# Patient Record
Sex: Male | Born: 1950
Health system: Southern US, Community
[De-identification: ages and names within clinical notes are randomized; demographics above are authoritative.]

## PROBLEM LIST (undated history)

## (undated) DIAGNOSIS — E785 Hyperlipidemia, unspecified: Secondary | ICD-10-CM

## (undated) DIAGNOSIS — I10 Essential (primary) hypertension: Secondary | ICD-10-CM

## (undated) DIAGNOSIS — E119 Type 2 diabetes mellitus without complications: Secondary | ICD-10-CM

## (undated) HISTORY — DX: Essential (primary) hypertension: I10

## (undated) HISTORY — DX: Hyperlipidemia, unspecified: E78.5

## (undated) HISTORY — PX: CORONARY STENT PLACEMENT: SHX1402

## (undated) HISTORY — DX: Type 2 diabetes mellitus without complications: E11.9

---

## 2014-04-13 DIAGNOSIS — N400 Enlarged prostate without lower urinary tract symptoms: Secondary | ICD-10-CM

## 2014-04-13 HISTORY — DX: Benign prostatic hyperplasia without lower urinary tract symptoms: N40.0

## 2014-05-20 DIAGNOSIS — Z1211 Encounter for screening for malignant neoplasm of colon: Secondary | ICD-10-CM | POA: Insufficient documentation

## 2014-05-20 DIAGNOSIS — Z860101 Personal history of adenomatous and serrated colon polyps: Secondary | ICD-10-CM

## 2014-05-20 HISTORY — DX: Personal history of adenomatous and serrated colon polyps: Z86.0101

## 2014-05-20 HISTORY — DX: Encounter for screening for malignant neoplasm of colon: Z12.11

## 2014-07-08 DIAGNOSIS — K5730 Diverticulosis of large intestine without perforation or abscess without bleeding: Secondary | ICD-10-CM | POA: Insufficient documentation

## 2014-07-08 HISTORY — DX: Diverticulosis of large intestine without perforation or abscess without bleeding: K57.30

## 2014-08-09 DIAGNOSIS — E785 Hyperlipidemia, unspecified: Secondary | ICD-10-CM

## 2014-08-09 DIAGNOSIS — I10 Essential (primary) hypertension: Secondary | ICD-10-CM | POA: Insufficient documentation

## 2014-08-09 DIAGNOSIS — E559 Vitamin D deficiency, unspecified: Secondary | ICD-10-CM | POA: Insufficient documentation

## 2014-08-09 HISTORY — DX: Vitamin D deficiency, unspecified: E55.9

## 2014-08-09 HISTORY — DX: Essential (primary) hypertension: I10

## 2014-08-09 HISTORY — DX: Hyperlipidemia, unspecified: E78.5

## 2015-09-30 DIAGNOSIS — I2510 Atherosclerotic heart disease of native coronary artery without angina pectoris: Secondary | ICD-10-CM

## 2015-09-30 HISTORY — DX: Atherosclerotic heart disease of native coronary artery without angina pectoris: I25.10

## 2015-11-21 DIAGNOSIS — E1165 Type 2 diabetes mellitus with hyperglycemia: Secondary | ICD-10-CM | POA: Insufficient documentation

## 2015-11-21 DIAGNOSIS — E119 Type 2 diabetes mellitus without complications: Secondary | ICD-10-CM | POA: Insufficient documentation

## 2015-11-21 HISTORY — DX: Type 2 diabetes mellitus with hyperglycemia: E11.65

## 2016-01-23 DIAGNOSIS — Z9181 History of falling: Secondary | ICD-10-CM | POA: Insufficient documentation

## 2016-01-23 HISTORY — DX: History of falling: Z91.81

## 2016-06-20 DIAGNOSIS — R194 Change in bowel habit: Secondary | ICD-10-CM

## 2016-06-20 HISTORY — DX: Change in bowel habit: R19.4

## 2017-06-11 ENCOUNTER — Encounter: Payer: Self-pay | Admitting: Cardiology

## 2017-06-18 ENCOUNTER — Ambulatory Visit (INDEPENDENT_AMBULATORY_CARE_PROVIDER_SITE_OTHER): Payer: Medicare HMO | Admitting: Cardiology

## 2017-06-18 ENCOUNTER — Encounter: Payer: Self-pay | Admitting: Cardiology

## 2017-06-18 VITALS — BP 106/68 | HR 68 | Resp 10 | Ht 71.0 in | Wt 188.8 lb

## 2017-06-18 DIAGNOSIS — E782 Mixed hyperlipidemia: Secondary | ICD-10-CM | POA: Diagnosis not present

## 2017-06-18 DIAGNOSIS — I10 Essential (primary) hypertension: Secondary | ICD-10-CM | POA: Diagnosis not present

## 2017-06-18 DIAGNOSIS — E088 Diabetes mellitus due to underlying condition with unspecified complications: Secondary | ICD-10-CM

## 2017-06-18 DIAGNOSIS — I2510 Atherosclerotic heart disease of native coronary artery without angina pectoris: Secondary | ICD-10-CM | POA: Diagnosis not present

## 2017-06-18 HISTORY — DX: Diabetes mellitus due to underlying condition with unspecified complications: E08.8

## 2017-06-18 NOTE — Patient Instructions (Addendum)
Medication Instructions:  Your physician recommends that you continue on your current medications as directed. Please refer to the Current Medication list given to you today.  Labwork: None  Testing/Procedures: Your physician has requested that you have en exercise stress myoview. For further information please visit [email or web address]. Please follow instruction sheet, as given.  Your physician has requested that you have an echocardiogram. Echocardiography is a painless test that uses sound waves to create images of your heart. It provides your doctor with information about the size and shape of your heart and how well your heart's chambers and valves are working. This procedure takes approximately one hour. There are no restrictions for this procedure.  Follow-Up: Your physician wants you to follow-up in: 6 months. You will receive a reminder letter in the mail two months in advance. If you don't receive a letter, please call our office to schedule the follow-up appointment.  Any Other Special Instructions Will Be Listed Below (If Applicable).  Please note that any paperwork needing to be filled out by the provider will need to be addressed at the front desk prior to seeing the provider. Please note that any paperwork FMLA, Disability or other documents regarding health condition is subject to a $25.00 charge that must be received prior to completion of paperwork in the form of a money order or check.    If you need a refill on your cardiac medications before your next appointment, please call your pharmacy.

## 2017-06-18 NOTE — Progress Notes (Signed)
Cardiology Office Note:    Date:  06/18/2017   ID:  Gary Barnett, DOB Nov 14, 1950, MRN [id number]  PCP:  Arlyss Repress, MD  Cardiologist:  Jenean Lindau, MD   Referring MD: Arlyss Repress, MD    ASSESSMENT:    1. Coronary artery disease involving native coronary artery of native heart without angina pectoris   2. Essential hypertension   3. Mixed hyperlipidemia   4. Diabetes mellitus due to underlying condition with complication, without long-term current use of insulin (HCC)    PLAN:    In order of problems listed above:  1. Secondary prevention stressed to the patient. Importance of compliance with diet and medication and exercise stressed and he vocalized understanding. His blood pressure stable and lipids are followed by his primary care physician. 2. Patient has continued multiple risk factors and leads a fairly sedentary lifestyle. His wife mentions to me that it has been a long-time since he has been evaluated for obstructive coronary artery disease and for that reason he will undergo Lexiscan sestamibi. Echocardiogram will be done to assess murmur heard on auscultation. 3. Sublingual nitroglycerin protocol explained to the patient and he already knows about for obvious reasons. He'll be seen in follow-up appointment in 6 months or earlier if he has any concerns.   Medication Adjustments/Labs and Tests Ordered: Current medicines are reviewed at length with the patient today.  Concerns regarding medicines are outlined above.  No orders of the defined types were placed in this encounter.  No orders of the defined types were placed in this encounter.    History of Present Illness:    Gary Barnett is a 66 y.o. male who is being seen today for the evaluation of Coronary artery disease at the request of Arlyss Repress, MD. Patient is a pleasant 66 year old male accompanied by his wife. He has history of coronary artery disease and underwent coronary stenting in 2002,  essential hypertension, dyslipidemia and diabetes mellitus. He leads a fairly sedentary lifestyle. He tells me that he walks regularly to the best of his ability. He is here to be established. He denies any chest pain orthopnea or PND. At the time of my evaluation he is alert awake oriented and in no distress.  Past Medical History:  Diagnosis Date  . Diabetes mellitus without complication ([place])   . Hyperlipidemia   . Hypertension     History reviewed. No pertinent surgical history.  Current Medications: Current Meds  Medication Sig  . ACETAMINOPHEN ER PO Take by mouth.  Marland Kitchen aspirin 325 MG tablet Take 325 mg by mouth.  . carvedilol (COREG) 25 MG tablet Take 12.5 mg by mouth 2 (two) times daily with a meal.   . clonazePAM (KLONOPIN) 0.5 MG tablet Take 0.5 mg by mouth 2 (two) times daily as needed for anxiety (Sleep).   Marland Kitchen ibuprofen (ADVIL,MOTRIN) 200 MG tablet Take 200 mg by mouth.  . levocetirizine (XYZAL) 5 MG tablet Take 5 mg by mouth every evening.   Marland Kitchen lisinopril (PRINIVIL,ZESTRIL) 5 MG tablet Take 5 mg by mouth daily.   . metFORMIN (GLUCOPHAGE) 1000 MG tablet Take 1,000 mg by mouth 2 (two) times daily with a meal.   . niacin 500 MG tablet Take 500 mg by mouth daily.   . nitroGLYCERIN (NITROSTAT) 0.4 MG SL tablet Place 0.4 mg under the tongue every 5 (five) minutes as needed for chest pain.   . pravastatin (PRAVACHOL) 40 MG tablet Take 40 mg by mouth daily.   . sertraline (ZOLOFT)  50 MG tablet Take 50 mg by mouth daily.   . [DISCONTINUED] meloxicam (MOBIC) 7.5 MG tablet Take 7.5 mg by mouth.  . [DISCONTINUED] Omega-3 Fatty Acids (FISH OIL) 1000 MG CAPS Frequency:   Dosage:0   MG  Instructions:  Note:Take 1 capsule twice daily     Allergies:   Clopidogrel   Social History   Social History  . Marital status: Married    Spouse name: N/A  . Number of children: N/A  . Years of education: N/A   Social History Main Topics  . Smoking status: Former Research scientist (life sciences)  . Smokeless tobacco: Never  Used  . Alcohol use No  . Drug use: No  . Sexual activity: Not Asked   Other Topics Concern  . None   Social History Narrative  . None     Family History: The patient's family history includes Heart attack in his brother; Heart disease in his brother; Hyperlipidemia in his mother; Hypertension in his father.  ROS:   Please see the history of present illness.    All other systems reviewed and are negative.  EKGs/Labs/Other Studies Reviewed:    The following studies were reviewed today: I reviewed RECORDS from primary care and discussed with the patient and his wife multiple questions that they had asked with their satisfaction.   Recent Labs: No results found for requested labs within last 8760 hours.  Recent Lipid Panel No results found for: CHOL, TRIG, HDL, CHOLHDL, VLDL, LDLCALC, LDLDIRECT  Physical Exam:    VS:  BP 106/68   Pulse 68   Resp 10   Ht 5\' 11"  (1.803 m)   Wt 188 lb 12.8 oz (85.6 kg)   BMI 26.33 kg/m     Wt Readings from Last 3 Encounters:  06/18/17 188 lb 12.8 oz (85.6 kg)     GEN: Patient is in no acute distress HEENT: Normal NECK: No JVD; No carotid bruits LYMPHATICS: No lymphadenopathy CARDIAC: S1 S2 regular, 2/6 systolic murmur at the apex. RESPIRATORY:  Clear to auscultation without rales, wheezing or rhonchi  ABDOMEN: Soft, non-tender, non-distended MUSCULOSKELETAL:  No edema; No deformity  SKIN: Warm and dry NEUROLOGIC:  Alert and oriented x 3 PSYCHIATRIC:  Normal affect    Signed, Jenean Lindau, MD  06/18/2017 4:48 PM    [facility]

## 2017-06-26 ENCOUNTER — Other Ambulatory Visit (INDEPENDENT_AMBULATORY_CARE_PROVIDER_SITE_OTHER): Payer: Medicare HMO

## 2017-06-26 DIAGNOSIS — I2510 Atherosclerotic heart disease of native coronary artery without angina pectoris: Secondary | ICD-10-CM

## 2017-06-26 DIAGNOSIS — I10 Essential (primary) hypertension: Secondary | ICD-10-CM

## 2017-06-27 ENCOUNTER — Other Ambulatory Visit (HOSPITAL_COMMUNITY): Payer: Medicare HMO

## 2017-06-27 ENCOUNTER — Encounter (HOSPITAL_COMMUNITY): Payer: Medicare HMO

## 2017-07-04 ENCOUNTER — Telehealth (HOSPITAL_COMMUNITY): Payer: Self-pay | Admitting: *Deleted

## 2017-07-04 NOTE — Telephone Encounter (Signed)
Left message on voicemail per DPR in reference to upcoming appointment scheduled on 07/10/17 with detailed instructions given per Myocardial Perfusion Study Information Sheet for the test. LM to arrive 15 minutes early, and that it is imperative to arrive on time for appointment to keep from having the test rescheduled. If you need to cancel or reschedule your appointment, please call the office within 24 hours of your appointment. Failure to do so may result in a cancellation of your appointment, and a $50 no show fee. Phone number given for call back for any questions. Kirstie Peri

## 2017-07-10 ENCOUNTER — Encounter (HOSPITAL_COMMUNITY): Payer: Medicare HMO

## 2017-07-10 ENCOUNTER — Other Ambulatory Visit (HOSPITAL_COMMUNITY): Payer: Medicare HMO

## 2017-08-14 ENCOUNTER — Telehealth (HOSPITAL_COMMUNITY): Payer: Self-pay | Admitting: Cardiology

## 2017-08-14 NOTE — Telephone Encounter (Signed)
06/24/17 NS/D.MILLER 08/07/17 /lmom evd  Patient cancelled appts on 8/23 an 9/5.Marland KitchenRG

## 2017-08-16 ENCOUNTER — Telehealth: Payer: Self-pay

## 2017-08-16 ENCOUNTER — Telehealth (HOSPITAL_COMMUNITY): Payer: Self-pay | Admitting: Cardiology

## 2017-08-16 NOTE — Telephone Encounter (Signed)
07/09/17 LMOM evd Cancel Rsn: Patient (pt has shingles wcb when shingles clear up ) 08/07/17 LMOM evd 08/15/17 LMOM evd   User: Cherie Dark A Date/time: 08/14/2017 1:43 PM  Comment: Called pt and lmsg for him to CB to get rescheduled for echo and myoview.   Context: [place] Schedule Orders/Appt Requests Outcome: Left Message  Phone number: [phone] Phone Type: Home Phone  Comm. type: Telephone Call type: Outgoing  Contact: [name] Relation to patient: Self  Letter:

## 2017-08-16 NOTE — Telephone Encounter (Signed)
Please call patient's wife , she has questions about stress test and medications.cn

## 2017-08-16 NOTE — Telephone Encounter (Signed)
Message left on patient's voice mail.

## 2017-08-16 NOTE — Telephone Encounter (Signed)
Dr. Geraldo Pitter personally spoke with the patient's wife to clarify.

## 2017-08-26 ENCOUNTER — Telehealth (HOSPITAL_COMMUNITY): Payer: Self-pay | Admitting: *Deleted

## 2017-08-26 NOTE — Telephone Encounter (Signed)
Left message on voicemail per DPR in reference to upcoming appointment scheduled on 08/29/17 at 0945 with detailed instructions given per Myocardial Perfusion Study Information Sheet for the test. LM to arrive 15 minutes early, and that it is imperative to arrive on time for appointment to keep from having the test rescheduled. If you need to cancel or reschedule your appointment, please call the office within 24 hours of your appointment. Failure to do so may result in a cancellation of your appointment, and a $50 no show fee. Phone number given for call back for any questions.

## 2017-08-29 ENCOUNTER — Other Ambulatory Visit: Payer: Self-pay

## 2017-08-29 ENCOUNTER — Ambulatory Visit (HOSPITAL_COMMUNITY): Payer: Medicare HMO | Attending: Cardiovascular Disease

## 2017-08-29 ENCOUNTER — Ambulatory Visit (HOSPITAL_BASED_OUTPATIENT_CLINIC_OR_DEPARTMENT_OTHER): Payer: Medicare HMO

## 2017-08-29 DIAGNOSIS — I10 Essential (primary) hypertension: Secondary | ICD-10-CM | POA: Insufficient documentation

## 2017-08-29 DIAGNOSIS — E785 Hyperlipidemia, unspecified: Secondary | ICD-10-CM | POA: Insufficient documentation

## 2017-08-29 DIAGNOSIS — I2510 Atherosclerotic heart disease of native coronary artery without angina pectoris: Secondary | ICD-10-CM | POA: Diagnosis not present

## 2017-08-29 DIAGNOSIS — E782 Mixed hyperlipidemia: Secondary | ICD-10-CM | POA: Diagnosis not present

## 2017-08-29 DIAGNOSIS — R9439 Abnormal result of other cardiovascular function study: Secondary | ICD-10-CM | POA: Insufficient documentation

## 2017-08-29 DIAGNOSIS — Z8249 Family history of ischemic heart disease and other diseases of the circulatory system: Secondary | ICD-10-CM | POA: Insufficient documentation

## 2017-08-29 DIAGNOSIS — I700 Atherosclerosis of aorta: Secondary | ICD-10-CM | POA: Insufficient documentation

## 2017-08-29 DIAGNOSIS — Z87891 Personal history of nicotine dependence: Secondary | ICD-10-CM | POA: Diagnosis not present

## 2017-08-29 DIAGNOSIS — E088 Diabetes mellitus due to underlying condition with unspecified complications: Secondary | ICD-10-CM

## 2017-08-29 LAB — MYOCARDIAL PERFUSION IMAGING
CHL CUP RESTING HR STRESS: 56 {beats}/min
CSEPPHR: 92 {beats}/min
LVDIAVOL: 102 mL (ref 62–150)
LVSYSVOL: 56 mL
RATE: 0.32
SDS: 4
SRS: 14
SSS: 18
TID: 1.11

## 2017-08-29 MED ORDER — REGADENOSON 0.4 MG/5ML IV SOLN
0.4000 mg | Freq: Once | INTRAVENOUS | Status: AC
  Administered 2017-08-29: 0.4 mg via INTRAVENOUS

## 2017-08-29 MED ORDER — TECHNETIUM TC 99M TETROFOSMIN IV KIT
32.8000 | PACK | Freq: Once | INTRAVENOUS | Status: AC | PRN
  Administered 2017-08-29: 32.8 via INTRAVENOUS
  Filled 2017-08-29: qty 33

## 2017-08-29 MED ORDER — TECHNETIUM TC 99M TETROFOSMIN IV KIT
10.7000 | PACK | Freq: Once | INTRAVENOUS | Status: AC | PRN
  Administered 2017-08-29: 10.7 via INTRAVENOUS
  Filled 2017-08-29: qty 11

## 2017-11-11 DIAGNOSIS — M159 Polyosteoarthritis, unspecified: Secondary | ICD-10-CM

## 2017-11-11 DIAGNOSIS — F419 Anxiety disorder, unspecified: Secondary | ICD-10-CM

## 2017-11-11 DIAGNOSIS — M150 Primary generalized (osteo)arthritis: Secondary | ICD-10-CM

## 2017-11-11 DIAGNOSIS — F325 Major depressive disorder, single episode, in full remission: Secondary | ICD-10-CM | POA: Insufficient documentation

## 2017-11-11 HISTORY — DX: Primary generalized (osteo)arthritis: M15.0

## 2017-11-11 HISTORY — DX: Polyosteoarthritis, unspecified: M15.9

## 2017-11-11 HISTORY — DX: Major depressive disorder, single episode, in full remission: F32.5

## 2017-11-11 HISTORY — DX: Anxiety disorder, unspecified: F41.9

## 2018-03-20 DIAGNOSIS — R5381 Other malaise: Secondary | ICD-10-CM

## 2018-03-20 DIAGNOSIS — Z125 Encounter for screening for malignant neoplasm of prostate: Secondary | ICD-10-CM | POA: Insufficient documentation

## 2018-03-20 HISTORY — DX: Encounter for screening for malignant neoplasm of prostate: Z12.5

## 2018-03-20 HISTORY — DX: Other malaise: R53.81

## 2019-05-29 ENCOUNTER — Other Ambulatory Visit: Payer: Self-pay

## 2019-06-03 ENCOUNTER — Other Ambulatory Visit: Payer: Self-pay

## 2019-06-03 ENCOUNTER — Ambulatory Visit (INDEPENDENT_AMBULATORY_CARE_PROVIDER_SITE_OTHER): Payer: Medicare HMO | Admitting: Family Medicine

## 2019-06-03 ENCOUNTER — Encounter: Payer: Self-pay | Admitting: Family Medicine

## 2019-06-03 VITALS — BP 124/84 | HR 64 | Temp 97.8°F | Ht 71.0 in | Wt 195.0 lb

## 2019-06-03 DIAGNOSIS — E119 Type 2 diabetes mellitus without complications: Secondary | ICD-10-CM | POA: Diagnosis not present

## 2019-06-03 DIAGNOSIS — Z125 Encounter for screening for malignant neoplasm of prostate: Secondary | ICD-10-CM

## 2019-06-03 DIAGNOSIS — Z8249 Family history of ischemic heart disease and other diseases of the circulatory system: Secondary | ICD-10-CM

## 2019-06-03 DIAGNOSIS — Z0000 Encounter for general adult medical examination without abnormal findings: Secondary | ICD-10-CM | POA: Diagnosis not present

## 2019-06-03 DIAGNOSIS — Z114 Encounter for screening for human immunodeficiency virus [HIV]: Secondary | ICD-10-CM

## 2019-06-03 DIAGNOSIS — G4700 Insomnia, unspecified: Secondary | ICD-10-CM

## 2019-06-03 HISTORY — DX: Insomnia, unspecified: G47.00

## 2019-06-03 LAB — LIPID PANEL
Cholesterol: 122 mg/dL (ref 0–200)
HDL: 34.1 mg/dL — ABNORMAL LOW (ref >39.00)
LDL Cholesterol: 57 mg/dL (ref 0–99)
NonHDL: 88.31
Total CHOL/HDL Ratio: 4
Triglycerides: 155 mg/dL — ABNORMAL HIGH (ref 0.0–149.0)
VLDL: 31 mg/dL (ref 0.0–40.0)

## 2019-06-03 LAB — COMPREHENSIVE METABOLIC PANEL
ALT: 25 U/L (ref 0–53)
AST: 17 U/L (ref 0–37)
Albumin: 4.7 g/dL (ref 3.5–5.2)
Alkaline Phosphatase: 102 U/L (ref 39–117)
BUN: 14 mg/dL (ref 6–23)
CO2: 27 mEq/L (ref 19–32)
Calcium: 9.5 mg/dL (ref 8.4–10.5)
Chloride: 101 mEq/L (ref 96–112)
Creatinine, Ser: 0.81 mg/dL (ref 0.40–1.50)
GFR: 95.4 mL/min (ref >60.00)
Glucose, Bld: 131 mg/dL — ABNORMAL HIGH (ref 70–99)
Potassium: 4.4 mEq/L (ref 3.5–5.1)
Sodium: 139 mEq/L (ref 135–145)
Total Bilirubin: 1 mg/dL (ref 0.2–1.2)
Total Protein: 6.9 g/dL (ref 6.0–8.3)

## 2019-06-03 LAB — CBC
HCT: 43.4 % (ref 39.0–52.0)
Hemoglobin: 14.7 g/dL (ref 13.0–17.0)
MCHC: 33.8 g/dL (ref 30.0–36.0)
MCV: 89.4 fl (ref 78.0–100.0)
Platelets: 214 10*3/uL (ref 150.0–400.0)
RBC: 4.85 Mil/uL (ref 4.22–5.81)
RDW: 14 % (ref 11.5–15.5)
WBC: 7.3 10*3/uL (ref 4.0–10.5)

## 2019-06-03 LAB — MICROALBUMIN / CREATININE URINE RATIO
Creatinine,U: 119.6 mg/dL
Microalb Creat Ratio: 0.6 mg/g (ref 0.0–30.0)
Microalb, Ur: <0.7 mg/dL (ref 0.0–1.9)

## 2019-06-03 LAB — HEMOGLOBIN A1C: Hgb A1c MFr Bld: 8.4 % — ABNORMAL HIGH (ref 4.6–6.5)

## 2019-06-03 LAB — PSA: PSA: 1.15 ng/mL (ref 0.10–4.00)

## 2019-06-03 MED ORDER — CARVEDILOL 25 MG PO TABS: 12.5000 mg | ORAL_TABLET | Freq: Two times a day (BID) | ORAL | 2 refills | Status: DC

## 2019-06-03 MED ORDER — ACCU-CHEK AVIVA PLUS VI STRP: ORAL_STRIP | 2 refills | Status: DC

## 2019-06-03 MED ORDER — LEVOCETIRIZINE DIHYDROCHLORIDE 5 MG PO TABS: 5.0000 mg | ORAL_TABLET | Freq: Every evening | ORAL | 2 refills | Status: DC

## 2019-06-03 MED ORDER — TRAZODONE HCL 50 MG PO TABS: 25.0000 mg | ORAL_TABLET | Freq: Every evening | ORAL | 2 refills | Status: DC | PRN

## 2019-06-03 MED ORDER — SERTRALINE HCL 50 MG PO TABS: 50.0000 mg | ORAL_TABLET | Freq: Every day | ORAL | 2 refills | Status: DC

## 2019-06-03 MED ORDER — GLIPIZIDE ER 2.5 MG PO TB24: 2.5000 mg | ORAL_TABLET | Freq: Every day | ORAL | 2 refills | Status: DC

## 2019-06-03 MED ORDER — METFORMIN HCL 1000 MG PO TABS: 1000.0000 mg | ORAL_TABLET | Freq: Two times a day (BID) | ORAL | 2 refills | Status: DC

## 2019-06-03 MED ORDER — ACCU-CHEK SOFTCLIX LANCETS MISC: 2 refills | Status: DC

## 2019-06-03 MED ORDER — PRAVASTATIN SODIUM 40 MG PO TABS: 40.0000 mg | ORAL_TABLET | Freq: Every day | ORAL | 2 refills | Status: DC

## 2019-06-03 MED ORDER — LISINOPRIL 5 MG PO TABS: 5.0000 mg | ORAL_TABLET | Freq: Every day | ORAL | 2 refills | Status: DC

## 2019-06-03 NOTE — Progress Notes (Signed)
Chief Complaint  Patient presents with  . New Patient (Initial Visit)    establish today       New Patient Visit SUBJECTIVE: HPI: Gary Barnett is an 68 y.o.male who is being seen for establishing care.  Pt w hx of anxiety/insomnia. He takes Zoloft 50 mg/d, tolerating well, reports compliance. Was on Elavil in past, did not work well for sleep. He had been on Klonopin nightly as well, last taking consistently 3 mo ago. Interested in trazodone. Would also use Melatonin.   Hx of DM, on metformin 1 g bid. Diet is healthy. Reports he was well controlled. Due for eye exam. Has not had foot exam or urine test in quite some time. Taking pravastatin.   Allergies  Allergen Reactions  . Clopidogrel     Past Medical History:  Diagnosis Date  . Diabetes mellitus without complication ([place])   . Hyperlipidemia   . Hypertension    History reviewed. No pertinent surgical history. Family History  Problem Relation Age of Onset  . Heart attack Brother   . Heart disease Brother   . Hyperlipidemia Mother   . Hypertension Father    Allergies  Allergen Reactions  . Clopidogrel     Current Outpatient Medications:  .  Accu-Chek Softclix Lancets lancets, Use twice daily to check blood sugar, Disp: 100 each, Rfl: 2 .  aspirin 325 MG tablet, Take 325 mg by mouth., Disp: , Rfl:  .  carvedilol (COREG) 25 MG tablet, Take 0.5 tablets (12.5 mg total) by mouth 2 (two) times daily with a meal., Disp: 90 tablet, Rfl: 2 .  ELDERBERRY PO, Take by mouth., Disp: , Rfl:  .  glipiZIDE (GLUCOTROL XL) 2.5 MG 24 hr tablet, Take 1 tablet (2.5 mg total) by mouth daily with breakfast., Disp: 90 tablet, Rfl: 2 .  glucose blood (ACCU-CHEK AVIVA PLUS) test strip, Test twice daily for blood sugar, Disp: 100 each, Rfl: 2 .  levocetirizine (XYZAL) 5 MG tablet, Take 1 tablet (5 mg total) by mouth every evening., Disp: 90 tablet, Rfl: 2 .  lisinopril (ZESTRIL) 5 MG tablet, Take 1 tablet (5 mg total) by mouth daily., Disp: 90  tablet, Rfl: 2 .  metFORMIN (GLUCOPHAGE) 1000 MG tablet, Take 1 tablet (1,000 mg total) by mouth 2 (two) times daily with a meal., Disp: 180 tablet, Rfl: 2 .  niacin 500 MG tablet, Take 500 mg by mouth daily. , Disp: , Rfl:  .  nitroGLYCERIN (NITROSTAT) 0.4 MG SL tablet, Place 0.4 mg under the tongue every 5 (five) minutes as needed for chest pain. , Disp: , Rfl:  .  pravastatin (PRAVACHOL) 40 MG tablet, Take 1 tablet (40 mg total) by mouth daily., Disp: 90 tablet, Rfl: 2 .  sertraline (ZOLOFT) 50 MG tablet, Take 1 tablet (50 mg total) by mouth daily., Disp: 90 tablet, Rfl: 2 .  Turmeric 500 MG CAPS, Take by mouth., Disp: , Rfl:  .  vitamin C (ASCORBIC ACID) 500 MG tablet, Take 500 mg by mouth 2 (two) times daily., Disp: , Rfl:  .  clonazePAM (KLONOPIN) 0.5 MG tablet, Take 0.5 mg by mouth 2 (two) times daily as needed for anxiety (Sleep). , Disp: , Rfl:  .  traZODone (DESYREL) 50 MG tablet, Take 0.5-1 tablets (25-50 mg total) by mouth at bedtime as needed for sleep., Disp: 90 tablet, Rfl: 2  ROS Cardiovascular: Denies chest pain  Respiratory: Denies dyspnea   OBJECTIVE: BP 124/84 (BP Location: Left Arm, Patient Position: Sitting, Cuff Size:  Normal)   Pulse 64   Temp 97.8 F (36.6 C) (Oral)   Ht 5\' 11"  (1.803 m)   Wt 195 lb (88.5 kg)   SpO2 98%   BMI 27.20 kg/m   Constitutional: -  VS reviewed -  Well developed, well nourished, appears stated age -  No apparent distress  Psychiatric: -  Oriented to person, place, and time -  Memory intact -  Affect and mood normal -  Fluent conversation, good eye contact -  Judgment and insight age appropriate  Eye: -  Conjunctivae clear, no discharge -  Pupils symmetric, round, reactive to light  ENMT: -  MMM    Pharynx moist, no exudate, no erythema  Neck: -  No gross swelling, no palpable masses -  Thyroid midline, not enlarged, mobile, no palpable masses  Cardiovascular: -  RRR -  No LE edema  Respiratory: -  Normal respiratory effort, no  accessory muscle use, no retraction -  Breath sounds equal, no wheezes, no ronchi, no crackles  Musculoskeletal: -  No clubbing, no cyanosis -  Gait normal  Skin: -  No lesions on feet b/l -  Warm and dry to palpation   ASSESSMENT/PLAN: Insomnia, unspecified type - Plan: stop Klonopin, start trazodone. [facility] info given. Sleep hygiene encouraged and written down.  Diabetes mellitus without complication ([place]) - Plan: Hemoglobin A1c, Microalbumin / creatinine urine ratio, HM DIABETES FOOT EXAM, due for eye exam.  Family history of heart attack - Plan: Ambulatory referral to Cardiology, per his request  Well adult exam - Plan: CBC, Comprehensive metabolic panel, Lipid panel  Screening for malignant neoplasm of prostate - Plan: PSA; pt requesting, has had it followed  Patient instructed to sign release of records form from his previous PCP. [facility], Dr Luetta Nutting Patient should return in 2 mo for CPE. The patient voiced understanding and agreement to the plan.   [place], DO 06/03/19  3:07 PM

## 2019-06-03 NOTE — Patient Instructions (Addendum)
The new Shingrix vaccine (for shingles) is a 2 shot series. It can make people feel low energy, achy and almost like they have the flu for 48 hours after injection. Please plan accordingly when deciding on when to get this shot. Call your pharmacy regarding this. The second shot of the series is less severe regarding the side effects, but it still lasts 48 hours.   Flu shot is not available yet.   Please consider counseling. Contact [phone] to schedule an appointment or inquire about cost/insurance coverage.  Sleep Hygiene Tips:  Do not watch TV or look at screens within 1 hour of going to bed. If you do, make sure there is a blue light filter (nighttime mode) involved.  Try to go to bed around the same time every night. Wake up at the same time within 1 hour of regular time. Ex: If you wake up at 7 AM for work, do not sleep past 8 AM on days that you don't work.  Do not drink alcohol before bedtime.  Do not consume caffeine-containing beverages after noon or within 9 hours of intended bedtime.  Get regular exercise/physical activity in your life, but not within 2 hours of planned bedtime.  Do not take naps.   Do not eat within 2 hours of planned bedtime.  Melatonin, 3-5 mg 30-60 minutes before planned bedtime may be helpful.   The bed should be for sleep or sex only. If after 20-30 minutes you are unable to fall asleep, get up and do something relaxing. Do this until you feel ready to go to sleep again.   Let us know if you need anything.

## 2019-06-05 ENCOUNTER — Ambulatory Visit: Payer: Medicare HMO | Admitting: Family Medicine

## 2019-06-08 ENCOUNTER — Other Ambulatory Visit: Payer: Self-pay | Admitting: Family Medicine

## 2019-06-08 MED ORDER — NITROGLYCERIN 0.4 MG SL SUBL: 0.4000 mg | SUBLINGUAL_TABLET | SUBLINGUAL | 1 refills | Status: DC | PRN

## 2019-06-08 NOTE — Telephone Encounter (Signed)
Rx sent 

## 2019-06-08 NOTE — Telephone Encounter (Signed)
Copied from [place] [phone]. Topic: Quick Communication - Rx Refill/Question >> Jun 08, 2019  4:43 PM Nils Flack wrote: Medication: nitroGLYCERIN (NITROSTAT) 0.4 MG SL tablet  Has the patient contacted their pharmacy? Yes.   (Agent: If no, request that the patient contact the pharmacy for the refill.) (Agent: If yes, when and what did the pharmacy advise?)  Preferred Pharmacy (with phone number or street name): [email or web address]   Agent: Please be advised that RX refills may take up to 3 business days. We ask that you follow-up with your pharmacy.

## 2019-06-09 ENCOUNTER — Other Ambulatory Visit: Payer: Self-pay | Admitting: Family Medicine

## 2019-06-09 LAB — HIV ANTIBODY (ROUTINE TESTING W REFLEX): HIV 1&2 Ab, 4th Generation: NONREACTIVE

## 2019-06-09 MED ORDER — GLIPIZIDE ER 5 MG PO TB24: 5.0000 mg | ORAL_TABLET | Freq: Every day | ORAL | 1 refills | Status: DC

## 2019-06-22 ENCOUNTER — Telehealth: Payer: Self-pay | Admitting: Family Medicine

## 2019-06-22 NOTE — Telephone Encounter (Signed)
Patient's wife is to request last lab results to be mailed to him. Thank you

## 2019-06-22 NOTE — Telephone Encounter (Signed)
Mailed to the patient

## 2019-06-26 NOTE — Telephone Encounter (Signed)
Mailed results  

## 2019-06-26 NOTE — Telephone Encounter (Signed)
Patient wife called back to request copy of his lab results mailed to them. She received her copy but not his. Please advise

## 2019-07-21 ENCOUNTER — Ambulatory Visit: Payer: Medicare HMO | Admitting: Cardiology

## 2019-07-27 ENCOUNTER — Telehealth: Payer: Self-pay | Admitting: Family Medicine

## 2019-07-27 NOTE — Telephone Encounter (Signed)
Copied from [place] [phone]. Topic: General - Other >> Jul 27, 2019  1:49 PM Pauline Good wrote: Reason for CRM: pt stated the lab results that was supposed to be fax to Dr Luetta Nutting (urologist) in [place] hadn't been received from his psa test.   Called the patient and informed faxed those results today 07/27/2019 to Dr. Elta Guadeloupe at [phone]

## 2019-07-28 DIAGNOSIS — R3989 Other symptoms and signs involving the genitourinary system: Secondary | ICD-10-CM | POA: Diagnosis not present

## 2019-07-30 ENCOUNTER — Ambulatory Visit (INDEPENDENT_AMBULATORY_CARE_PROVIDER_SITE_OTHER): Payer: Medicare HMO | Admitting: Cardiology

## 2019-07-30 ENCOUNTER — Other Ambulatory Visit: Payer: Self-pay

## 2019-07-30 ENCOUNTER — Encounter: Payer: Self-pay | Admitting: Cardiology

## 2019-07-30 VITALS — BP 116/70 | HR 68 | Ht 71.0 in | Wt 196.0 lb

## 2019-07-30 DIAGNOSIS — E782 Mixed hyperlipidemia: Secondary | ICD-10-CM | POA: Diagnosis not present

## 2019-07-30 DIAGNOSIS — I2510 Atherosclerotic heart disease of native coronary artery without angina pectoris: Secondary | ICD-10-CM

## 2019-07-30 DIAGNOSIS — I10 Essential (primary) hypertension: Secondary | ICD-10-CM

## 2019-07-30 DIAGNOSIS — E088 Diabetes mellitus due to underlying condition with unspecified complications: Secondary | ICD-10-CM

## 2019-07-30 NOTE — Patient Instructions (Signed)
Medication Instructions:  Your physician recommends that you continue on your current medications as directed. Please refer to the Current Medication list given to you today.  If you need a refill on your cardiac medications before your next appointment, please call your pharmacy.   Lab work: NONE If you have labs (blood work) drawn today and your tests are completely normal, you will receive your results only by: . MyChart Message (if you have MyChart) OR . A paper copy in the mail If you have any lab test that is abnormal or we need to change your treatment, we will call you to review the results.  Testing/Procedures: You had an EKG performed today  Follow-Up: At [facility], you and your health needs are our priority.  As part of our continuing mission to provide you with exceptional heart care, we have created designated Provider Care Teams.  These Care Teams include your primary Cardiologist (physician) and Advanced Practice Providers (APPs -  Physician Assistants and Nurse Practitioners) who all work together to provide you with the care you need, when you need it. You will need a follow up appointment in 6 months.   

## 2019-07-30 NOTE — Progress Notes (Signed)
Cardiology Office Note:    Date:  07/30/2019   ID:  Gary Barnett, DOB 1951/02/27, MRN [id number]  PCP:  Shelda Pal, DO  Cardiologist:  Jenean Lindau, MD   Referring MD: Shelda Pal*    ASSESSMENT:    1. Coronary artery disease involving native coronary artery of native heart without angina pectoris   2. Essential hypertension   3. Diabetes mellitus due to underlying condition with unspecified complications (Gary Barnett)   4. Mixed hyperlipidemia    PLAN:    In order of problems listed above:  1. Secondary prevention stressed with the patient.  Importance of compliance with diet and medication stressed and he vocalized understanding.  His blood pressure stable.  Diet was discussed 2. Dyslipidemia and diabetes mellitus: Diet was discussed.  He has an excellent effort tolerance.  His hemoglobin A1c is elevated and I told him to consider seeing a dietitian and possibly endocrinologist to see if his medications need to be fine-tuned.  He is going to talk to his primary care physician about it. 3. Patient will be seen in follow-up appointment in 6 months or earlier if the patient has any concerns    Medication Adjustments/Labs and Tests Ordered: Current medicines are reviewed at length with the patient today.  Concerns regarding medicines are outlined above.  No orders of the defined types were placed in this encounter.  No orders of the defined types were placed in this encounter.    Chief Complaint  Patient presents with  . Annual Exam    Been 2 years since saw Cardiologist     History of Present Illness:    Gary Barnett is a 68 y.o. male.  Patient has past medical history of coronary artery disease, essential hypertension, dyslipidemia and diabetes mellitus.  He denies any problems at this time and takes care of activities of daily living.  No chest pain orthopnea or PND.  At the time of my evaluation, the patient is alert awake oriented and in no  distress.  He uses his elliptical for about an hour a day on a daily basis.  Past Medical History:  Diagnosis Date  . Diabetes mellitus without complication ([place])   . Hyperlipidemia   . Hypertension     No past surgical history on file.  Current Medications: Current Meds  Medication Sig  . Accu-Chek Softclix Lancets lancets Use twice daily to check blood sugar  . aspirin 325 MG tablet Take 325 mg by mouth.  . carvedilol (COREG) 25 MG tablet Take 0.5 tablets (12.5 mg total) by mouth 2 (two) times daily with a meal.  . ELDERBERRY PO Take by mouth.  Marland Kitchen glipiZIDE (GLUCOTROL XL) 5 MG 24 hr tablet Take 1 tablet (5 mg total) by mouth daily with breakfast.  . glucose blood (ACCU-CHEK AVIVA PLUS) test strip Test twice daily for blood sugar  . levocetirizine (XYZAL) 5 MG tablet Take 1 tablet (5 mg total) by mouth every evening.  Marland Kitchen lisinopril (ZESTRIL) 5 MG tablet Take 1 tablet (5 mg total) by mouth daily.  . metFORMIN (GLUCOPHAGE) 1000 MG tablet Take 1 tablet (1,000 mg total) by mouth 2 (two) times daily with a meal.  . niacin 500 MG tablet Take 500 mg by mouth daily.   . nitroGLYCERIN (NITROSTAT) 0.4 MG SL tablet Place 1 tablet (0.4 mg total) under the tongue every 5 (five) minutes x 3 doses as needed for chest pain.  . pravastatin (PRAVACHOL) 40 MG tablet Take 1 tablet (40 mg  total) by mouth daily.  . sertraline (ZOLOFT) 50 MG tablet Take 1 tablet (50 mg total) by mouth daily.  . traZODone (DESYREL) 50 MG tablet Take 0.5-1 tablets (25-50 mg total) by mouth at bedtime as needed for sleep.  . Turmeric 500 MG CAPS Take by mouth.  . vitamin C (ASCORBIC ACID) 500 MG tablet Take 500 mg by mouth 2 (two) times daily.     Allergies:   Clopidogrel   Social History   Socioeconomic History  . Marital status: Married    Spouse name: Not on file  . Number of children: Not on file  . Years of education: Not on file  . Highest education level: Not on file  Occupational History  . Not on file   Social Needs  . Financial resource strain: Not on file  . Food insecurity    Worry: Not on file    Inability: Not on file  . Transportation needs    Medical: Not on file    Non-medical: Not on file  Tobacco Use  . Smoking status: Former Research scientist (life sciences)  . Smokeless tobacco: Never Used  Substance and Sexual Activity  . Alcohol use: No  . Drug use: No  . Sexual activity: Not on file  Lifestyle  . Physical activity    Days per week: Not on file    Minutes per session: Not on file  . Stress: Not on file  Relationships  . Social Herbalist on phone: Not on file    Gets together: Not on file    Attends religious service: Not on file    Active member of club or organization: Not on file    Attends meetings of clubs or organizations: Not on file    Relationship status: Not on file  Other Topics Concern  . Not on file  Social History Narrative  . Not on file     Family History: The patient's family history includes Heart attack in his brother; Heart disease in his brother; Hyperlipidemia in his mother; Hypertension in his father.  ROS:   Please see the history of present illness.    All other systems reviewed and are negative.  EKGs/Labs/Other Studies Reviewed:    The following studies were reviewed today: I reviewed lab work.  He recently had blood work and lipids by his primary care physician and was told that they were fine   Recent Labs: 06/03/2019: ALT 25; BUN 14; Creatinine, Ser 0.81; Hemoglobin 14.7; Platelets 214.0; Potassium 4.4; Sodium 139  Recent Lipid Panel    Component Value Date/Time   CHOL 122 06/03/2019 1330   TRIG 155.0 (H) 06/03/2019 1330   HDL 34.10 (L) 06/03/2019 1330   CHOLHDL 4 06/03/2019 1330   VLDL 31.0 06/03/2019 1330   LDLCALC 57 06/03/2019 1330    Physical Exam:    VS:  BP 116/70 (BP Location: Left Arm, Patient Position: Sitting, Cuff Size: Normal)   Pulse 68   Ht 5\' 11"  (1.803 m)   Wt 196 lb (88.9 kg)   SpO2 98%   BMI 27.34 kg/m      Wt Readings from Last 3 Encounters:  07/30/19 196 lb (88.9 kg)  06/03/19 195 lb (88.5 kg)  08/29/17 188 lb (85.3 kg)     GEN: Patient is in no acute distress HEENT: Normal NECK: No JVD; No carotid bruits LYMPHATICS: No lymphadenopathy CARDIAC: Hear sounds regular, 2/6 systolic murmur at the apex. RESPIRATORY:  Clear to auscultation without rales, wheezing or  rhonchi  ABDOMEN: Soft, non-tender, non-distended MUSCULOSKELETAL:  No edema; No deformity  SKIN: Warm and dry NEUROLOGIC:  Alert and oriented x 3 PSYCHIATRIC:  Normal affect   Signed, Jenean Lindau, MD  07/30/2019 11:23 AM    [place]

## 2019-08-03 DIAGNOSIS — D2372 Other benign neoplasm of skin of left lower limb, including hip: Secondary | ICD-10-CM | POA: Diagnosis not present

## 2019-08-03 DIAGNOSIS — D2271 Melanocytic nevi of right lower limb, including hip: Secondary | ICD-10-CM | POA: Diagnosis not present

## 2019-08-03 DIAGNOSIS — L723 Sebaceous cyst: Secondary | ICD-10-CM | POA: Diagnosis not present

## 2019-08-03 DIAGNOSIS — D2272 Melanocytic nevi of left lower limb, including hip: Secondary | ICD-10-CM | POA: Diagnosis not present

## 2019-08-03 DIAGNOSIS — D1801 Hemangioma of skin and subcutaneous tissue: Secondary | ICD-10-CM | POA: Diagnosis not present

## 2019-08-03 DIAGNOSIS — L820 Inflamed seborrheic keratosis: Secondary | ICD-10-CM | POA: Diagnosis not present

## 2019-08-03 DIAGNOSIS — D225 Melanocytic nevi of trunk: Secondary | ICD-10-CM | POA: Diagnosis not present

## 2019-08-03 DIAGNOSIS — L821 Other seborrheic keratosis: Secondary | ICD-10-CM | POA: Diagnosis not present

## 2019-08-05 ENCOUNTER — Ambulatory Visit (INDEPENDENT_AMBULATORY_CARE_PROVIDER_SITE_OTHER): Payer: Medicare HMO | Admitting: Family Medicine

## 2019-08-05 ENCOUNTER — Other Ambulatory Visit: Payer: Self-pay

## 2019-08-05 ENCOUNTER — Encounter: Payer: Self-pay | Admitting: Family Medicine

## 2019-08-05 VITALS — BP 104/68 | HR 73 | Temp 95.0°F | Ht 71.0 in | Wt 199.0 lb

## 2019-08-05 DIAGNOSIS — Z0000 Encounter for general adult medical examination without abnormal findings: Secondary | ICD-10-CM

## 2019-08-05 DIAGNOSIS — Z136 Encounter for screening for cardiovascular disorders: Secondary | ICD-10-CM | POA: Diagnosis not present

## 2019-08-05 DIAGNOSIS — Z23 Encounter for immunization: Secondary | ICD-10-CM | POA: Diagnosis not present

## 2019-08-05 MED ORDER — GLIPIZIDE ER 5 MG PO TB24: 5.0000 mg | ORAL_TABLET | Freq: Every day | ORAL | 2 refills | Status: DC

## 2019-08-05 MED ORDER — METFORMIN HCL 1000 MG PO TABS: 1000.0000 mg | ORAL_TABLET | Freq: Two times a day (BID) | ORAL | 2 refills | Status: DC

## 2019-08-05 NOTE — Progress Notes (Signed)
Chief Complaint  Patient presents with  . Annual Exam    Well Male Gary Barnett is here for a complete physical.   His last physical was >1 year ago.  Current diet: in general, a "healthy" diet.   Current exercise: walking, seated elliptical Weight trend: stable Daytime fatigue? No. Seat belt? Yes.    Health maintenance Shingrix- No Colonoscopy- Yes Tetanus- No Hep C- Yes Pneumonia vaccine- Yes; had 4 yrs ago,  AAA screening- Due  Past Medical History:  Diagnosis Date  . Diabetes mellitus without complication ([place])   . Hyperlipidemia   . Hypertension      Past Surgical History:  Procedure Laterality Date  . CORONARY STENT PLACEMENT     Thinks left main    Medications  Current Outpatient Medications on File Prior to Visit  Medication Sig Dispense Refill  . Accu-Chek Softclix Lancets lancets Use twice daily to check blood sugar 100 each 2  . aspirin 325 MG tablet Take 325 mg by mouth.    . carvedilol (COREG) 25 MG tablet Take 0.5 tablets (12.5 mg total) by mouth 2 (two) times daily with a meal. 90 tablet 2  . ELDERBERRY PO Take by mouth.    Marland Kitchen glucose blood (ACCU-CHEK AVIVA PLUS) test strip Test twice daily for blood sugar 100 each 2  . levocetirizine (XYZAL) 5 MG tablet Take 1 tablet (5 mg total) by mouth every evening. 90 tablet 2  . lisinopril (ZESTRIL) 5 MG tablet Take 1 tablet (5 mg total) by mouth daily. 90 tablet 2  . niacin 500 MG tablet Take 500 mg by mouth daily.     . nitroGLYCERIN (NITROSTAT) 0.4 MG SL tablet Place 1 tablet (0.4 mg total) under the tongue every 5 (five) minutes x 3 doses as needed for chest pain. 25 tablet 1  . pravastatin (PRAVACHOL) 40 MG tablet Take 1 tablet (40 mg total) by mouth daily. 90 tablet 2  . sertraline (ZOLOFT) 50 MG tablet Take 1 tablet (50 mg total) by mouth daily. 90 tablet 2  . traZODone (DESYREL) 50 MG tablet Take 0.5-1 tablets (25-50 mg total) by mouth at bedtime as needed for sleep. 90 tablet 2  . Turmeric 500 MG CAPS  Take by mouth.    . vitamin C (ASCORBIC ACID) 500 MG tablet Take 500 mg by mouth 2 (two) times daily.      Allergies Allergies  Allergen Reactions  . Clopidogrel     Family History Family History  Problem Relation Age of Onset  . Heart attack Brother   . Heart disease Brother   . Hyperlipidemia Mother   . Hypertension Father     Review of Systems: Constitutional:  no fevers or chills Eye:  no recent significant change in vision Ear/Nose/Mouth/Throat:  Ears:  no recent hearing loss Nose/Mouth/Throat:  no complaints of nasal congestion or sore throat Cardiovascular:  no chest pain, no palpitations Respiratory:  no cough and no shortness of breath Gastrointestinal:  no abdominal pain, no change in bowel habits GU:  Male: negative for dysuria, frequency, and incontinence and negative for prostate symptoms Musculoskeletal/Extremities:  no pain, redness, or swelling of the joints Integumentary (Skin):  no abnormal skin lesions reported Neurologic:  no headaches, Endocrine:  No unexpected weight changes Hematologic/Lymphatic:  no areas of easy bruising  Exam BP 104/68 (BP Location: Left Arm, Patient Position: Sitting, Cuff Size: Normal)   Pulse 73   Temp (!) 95 F (35 C) (Temporal)   Ht 5\' 11"  (1.803 m)  Wt 199 lb (90.3 kg)   SpO2 96%   BMI 27.75 kg/m  General:  well developed, well nourished, in no apparent distress Skin:  no significant moles, warts, or growths Head:  no masses, lesions, or tenderness Eyes:  pupils equal and round, sclera anicteric without injection Ears:  canals without lesions, TMs shiny without retraction, no obvious effusion, no erythema Nose:  nares patent, septum midline, mucosa normal Throat/Pharynx:  lips and gingiva without lesion; tongue and uvula midline; non-inflamed pharynx; no exudates or postnasal drainage Neck: neck supple without adenopathy, thyromegaly, or masses Lungs:  clear to auscultation, breath sounds equal bilaterally, no  respiratory distress Cardio:  regular rate and rhythm, no LE edema or bruits Abdomen:  abdomen soft, nontender; bowel sounds normal; no masses or organomegaly Rectal: Deferred Musculoskeletal:  symmetrical muscle groups noted without atrophy or deformity Extremities:  no clubbing, cyanosis, or edema, no deformities, no skin discoloration Neuro:  gait normal; deep tendon reflexes normal and symmetric Psych: well oriented with normal range of affect and appropriate judgment/insight  Assessment and Plan  Well adult exam  Screening for AAA (abdominal aortic aneurysm) - Plan: US AORTA MEDICARE SCREENING   Well 68 y.o. male. Counseled on diet and exercise. Call eye provider.  Other orders as above. Follow up in 2 mo for DM visit, sounds like he is doing well.  The patient voiced understanding and agreement to the plan.  [place], DO 08/05/19 10:58 AM

## 2019-08-05 NOTE — Addendum Note (Signed)
Addended by: Sharon Seller B on: 08/05/2019 11:09 AM   Modules accepted: Orders

## 2019-08-05 NOTE — Patient Instructions (Addendum)
Keep the diet clean and stay active.  Call your eye doctor for an appointment.   Someone will reach out regarding the ultrasound.  Let us know if you need anything.

## 2019-08-11 ENCOUNTER — Ambulatory Visit (HOSPITAL_BASED_OUTPATIENT_CLINIC_OR_DEPARTMENT_OTHER)
Admission: RE | Admit: 2019-08-11 | Discharge: 2019-08-11 | Disposition: A | Discharge status: Home / Self Care | Payer: Medicare HMO | Source: Ambulatory Visit | Attending: Family Medicine | Admitting: Family Medicine

## 2019-08-11 ENCOUNTER — Encounter (HOSPITAL_BASED_OUTPATIENT_CLINIC_OR_DEPARTMENT_OTHER): Payer: Self-pay

## 2019-08-11 ENCOUNTER — Other Ambulatory Visit: Payer: Self-pay

## 2019-08-11 DIAGNOSIS — Z87891 Personal history of nicotine dependence: Secondary | ICD-10-CM | POA: Insufficient documentation

## 2019-08-11 DIAGNOSIS — Z136 Encounter for screening for cardiovascular disorders: Secondary | ICD-10-CM | POA: Diagnosis not present

## 2019-08-11 DIAGNOSIS — I714 Abdominal aortic aneurysm, without rupture: Secondary | ICD-10-CM | POA: Diagnosis not present

## 2019-09-24 DIAGNOSIS — M9903 Segmental and somatic dysfunction of lumbar region: Secondary | ICD-10-CM | POA: Diagnosis not present

## 2019-09-24 DIAGNOSIS — M9901 Segmental and somatic dysfunction of cervical region: Secondary | ICD-10-CM | POA: Diagnosis not present

## 2019-10-06 ENCOUNTER — Other Ambulatory Visit: Payer: Self-pay

## 2019-10-07 ENCOUNTER — Ambulatory Visit (INDEPENDENT_AMBULATORY_CARE_PROVIDER_SITE_OTHER): Payer: Medicare HMO | Admitting: Family Medicine

## 2019-10-07 ENCOUNTER — Encounter: Payer: Self-pay | Admitting: Family Medicine

## 2019-10-07 VITALS — BP 104/64 | HR 76 | Temp 96.0°F | Ht 72.0 in | Wt 195.4 lb

## 2019-10-07 DIAGNOSIS — E1165 Type 2 diabetes mellitus with hyperglycemia: Secondary | ICD-10-CM

## 2019-10-07 DIAGNOSIS — G4700 Insomnia, unspecified: Secondary | ICD-10-CM

## 2019-10-07 LAB — HEMOGLOBIN A1C: Hgb A1c MFr Bld: 7.7 % — ABNORMAL HIGH (ref 4.6–6.5)

## 2019-10-07 MED ORDER — TRAZODONE HCL 100 MG PO TABS: 100.0000 mg | ORAL_TABLET | Freq: Every evening | ORAL | 2 refills | Status: DC | PRN

## 2019-10-07 NOTE — Patient Instructions (Signed)
Give us 2-3 business days to get the results of your labs back.   Keep the diet clean and stay active.  Let us know if you need anything. 

## 2019-10-07 NOTE — Progress Notes (Signed)
Subjective:   Chief Complaint  Patient presents with  . Follow-up    Gary Barnett is a 68 y.o. male here for follow-up of diabetes.   [place]'s self monitored glucose range is 120-130's fasting.  Patient denies hypoglycemic reactions. He checks his glucose levels 1 time per day. Patient does not require insulin.   Medications include: Metformin 1000 mg bid, glipizide 5 mg/d Diet is fair Exercise: walking, elliptical machine, wt resistance exercise.   Insomnia Doing OK w trazodone, would like to increase dose. Compliant, no AE's. Sometimes feels it isn't strong enough. No new triggers or happenings.   Past Medical History:  Diagnosis Date  . Diabetes mellitus without complication ([place])   . Hyperlipidemia   . Hypertension      Related testing: Date of retinal exam: Going tomorrow Pneumovax: done Flu Shot: done  Review of Systems: Pulmonary:  No SOB Cardiovascular:  No chest pain  Objective:  BP 104/64 (BP Location: Left Arm, Patient Position: Sitting, Cuff Size: Normal)   Pulse 76   Temp (!) 96 F (35.6 C) (Temporal)   Ht 6' (1.829 m)   Wt 195 lb 6 oz (88.6 kg)   SpO2 97%   BMI 26.50 kg/m  General:  Well developed, well nourished, in no apparent distress Lungs:  CTAB, no access msc use Cardio:  RRR, no bruits, no LE edema Psych: Age appropriate judgment and insight  Assessment:   Type 2 diabetes mellitus with hyperglycemia, without long-term current use of insulin (HCC) - Plan: HgB A1c  Insomnia, unspecified type - Plan: traZODone (DESYREL) 100 MG tablet   Plan:   Cont Metfomrin and Glipizide. Eager to see A1c.  Counseled on diet and exercise. Send results in mail.  Increase dose of Trazodone from 50 mg qhs to 100 mg qhs.  F/u in 5 mo. The patient voiced understanding and agreement to the plan.  [place], DO 10/07/19 10:52 AM

## 2019-10-28 ENCOUNTER — Other Ambulatory Visit: Payer: Self-pay | Admitting: Family Medicine

## 2019-10-28 MED ORDER — BD SWAB SINGLE USE REGULAR PADS: MEDICATED_PAD | 3 refills | Status: DC

## 2019-10-28 MED ORDER — ACCU-CHEK AVIVA PLUS VI STRP: ORAL_STRIP | 3 refills | Status: DC

## 2019-10-28 MED ORDER — ACCU-CHEK SOFTCLIX LANCETS MISC: 3 refills | Status: DC

## 2020-01-12 ENCOUNTER — Telehealth: Payer: Self-pay | Admitting: Family Medicine

## 2020-01-12 ENCOUNTER — Ambulatory Visit (INDEPENDENT_AMBULATORY_CARE_PROVIDER_SITE_OTHER): Payer: Medicare Other | Admitting: Family Medicine

## 2020-01-12 ENCOUNTER — Other Ambulatory Visit: Payer: Self-pay

## 2020-01-12 ENCOUNTER — Encounter: Payer: Self-pay | Admitting: Family Medicine

## 2020-01-12 DIAGNOSIS — G4700 Insomnia, unspecified: Secondary | ICD-10-CM

## 2020-01-12 DIAGNOSIS — E1165 Type 2 diabetes mellitus with hyperglycemia: Secondary | ICD-10-CM | POA: Diagnosis not present

## 2020-01-12 MED ORDER — GLIPIZIDE ER 5 MG PO TB24: 5.0000 mg | ORAL_TABLET | Freq: Every day | ORAL | 2 refills | Status: DC

## 2020-01-12 MED ORDER — NITROGLYCERIN 0.4 MG SL SUBL: 0.4000 mg | SUBLINGUAL_TABLET | SUBLINGUAL | 1 refills | Status: DC | PRN

## 2020-01-12 MED ORDER — METFORMIN HCL 1000 MG PO TABS: 1000.0000 mg | ORAL_TABLET | Freq: Two times a day (BID) | ORAL | 2 refills | Status: DC

## 2020-01-12 MED ORDER — LISINOPRIL 5 MG PO TABS: 5.0000 mg | ORAL_TABLET | Freq: Every day | ORAL | 2 refills | Status: DC

## 2020-01-12 MED ORDER — MIRTAZAPINE 30 MG PO TABS: 30.0000 mg | ORAL_TABLET | Freq: Every day | ORAL | 2 refills | Status: DC

## 2020-01-12 MED ORDER — PRAVASTATIN SODIUM 40 MG PO TABS: 40.0000 mg | ORAL_TABLET | Freq: Every day | ORAL | 2 refills | Status: DC

## 2020-01-12 MED ORDER — SERTRALINE HCL 50 MG PO TABS: 50.0000 mg | ORAL_TABLET | Freq: Every day | ORAL | 2 refills | Status: DC

## 2020-01-12 MED ORDER — CARVEDILOL 25 MG PO TABS: 12.5000 mg | ORAL_TABLET | Freq: Two times a day (BID) | ORAL | 2 refills | Status: DC

## 2020-01-12 NOTE — Telephone Encounter (Signed)
Have taken care of

## 2020-01-12 NOTE — Telephone Encounter (Signed)
Patient would like all medication to have a 90 day supply.

## 2020-01-12 NOTE — Progress Notes (Signed)
Subjective:   Chief Complaint  Patient presents with  . Follow-up    3 month    Gary Barnett is a 69 y.o. male here for follow-up of diabetes.  Due to COVID-19 pandemic, we are interacting via telephone. I verified patient's ID using 2 identifiers. Patient agreed to proceed with visit via this method. Patient is at home, I am at office. Patient and I are present for visit.  [place]'s self monitored glucose range is mid-low 100's.  Patient denies hypoglycemic reactions. He checks his glucose levels daily. Patient does not require insulin.   Medications include: Glucotrol XL 5 mg/d, metformin 1000 mg bid Diet is fair overall.  Exercise: Walking, wt resistance exercise Interested in seeing a nutritionist.  Insomnia Hx of insomnia, did not do well with Elavil or trazodone. Requesting something else.   Past Medical History:  Diagnosis Date  . Diabetes mellitus without complication ([place])   . Hyperlipidemia   . Hypertension      Related testing: Date of retinal exam: Due; just scheduled Pneumovax: done Flu Shot: done  Review of Systems: Pulmonary:  No SOB Cardiovascular:  No chest pain  Objective:  There were no vitals taken for this visit. General:  Well developed, well nourished, in no apparent distress Skin:  Warm, no pallor or diaphoresis Head:  Normocephalic, atraumatic Eyes:  Pupils equal and round, sclera anicteric without injection  Lungs:  CTAB, no access msc use Cardio:  RRR, no bruits, no LE edema Musculoskeletal:  Symmetrical muscle groups noted without atrophy or deformity Neuro:  Sensation intact to pinprick on feet Psych: Age appropriate judgment and insight  Assessment:   Type 2 diabetes mellitus with hyperglycemia, without long-term current use of insulin (HCC) - Plan: Amb ref to Medical Nutrition Therapy-MNT, Hemoglobin A1c  Insomnia, unspecified type - Plan: Ambulatory referral to Pulmonology   Plan:   1- Ck A1c. Refer nutrition. Counseled on diet  and exercise. 2- Refer to pulm, Trial Mirtazapine.  F/u for labs at convenience. Total time: 11 min The patient voiced understanding and agreement to the plan.  [place], DO 01/12/20 2:02 PM

## 2020-01-21 ENCOUNTER — Other Ambulatory Visit: Payer: Medicare Other

## 2020-01-26 DIAGNOSIS — H35033 Hypertensive retinopathy, bilateral: Secondary | ICD-10-CM | POA: Diagnosis not present

## 2020-01-26 LAB — HM DIABETES EYE EXAM

## 2020-01-27 ENCOUNTER — Encounter: Payer: Self-pay | Admitting: Pulmonary Disease

## 2020-01-27 ENCOUNTER — Other Ambulatory Visit: Payer: Self-pay

## 2020-01-27 ENCOUNTER — Ambulatory Visit: Payer: Medicare Other | Admitting: Pulmonary Disease

## 2020-01-27 ENCOUNTER — Institutional Professional Consult (permissible substitution): Payer: Medicare Other | Admitting: Pulmonary Disease

## 2020-01-27 VITALS — BP 128/68 | HR 61 | Temp 97.1°F | Ht 72.0 in | Wt 202.2 lb

## 2020-01-27 DIAGNOSIS — G4700 Insomnia, unspecified: Secondary | ICD-10-CM | POA: Diagnosis not present

## 2020-01-27 MED ORDER — ZALEPLON 5 MG PO CAPS: 5.0000 mg | ORAL_CAPSULE | Freq: Every evening | ORAL | 1 refills | Status: DC | PRN

## 2020-01-27 NOTE — Progress Notes (Signed)
Subjective:    Patient ID: Gary Barnett, male    DOB: Aug 15, 1951, 69 y.o.   MRN: [id number]  Patient with a history of chronic insomnia  Insomnia has been for about 5 years  His spouse was having some health problems about 5 years ago and he was staying up a lot at night to try and make sure she is safe She has improved now but he is left with insomnia  He has a hard time going to sleep, sometimes has a hard time going back to sleep once he wakes up  Usually goes to bed between 11 and 12 PM Sometimes takes him about 15 minutes to an hour to fall asleep He will wake up about 2 to 3 hours after sleeping Wakes up about 2-3 times during the night  Final awakening time about 10 AM  He does take daytime naps sometimes 1-1/2 to 2 hours  He usually does exercise on a regular basis His weight has been relatively stable may have gained about 5 to 10 pounds of the last couple years  Past Medical History:  Diagnosis Date  . Diabetes mellitus without complication ([place])   . Hyperlipidemia   . Hypertension    Social History   Socioeconomic History  . Marital status: Married    Spouse name: Not on file  . Number of children: Not on file  . Years of education: Not on file  . Highest education level: Not on file  Occupational History  . Not on file  Tobacco Use  . Smoking status: Former Smoker    Packs/day: 1.00    Years: 25.00    Pack years: 25.00    Types: Cigarettes  . Smokeless tobacco: Never Used  Substance and Sexual Activity  . Alcohol use: No  . Drug use: No  . Sexual activity: Not on file  Other Topics Concern  . Not on file  Social History Narrative  . Not on file   Social Determinants of Health   Financial Resource Strain:   . Difficulty of Paying Living Expenses:   Food Insecurity:   . Worried About Charity fundraiser in the Last Year:   . Arboriculturist in the Last Year:   Transportation Needs:   . Film/video editor (Medical):   Marland Kitchen Lack of  Transportation (Non-Medical):   Physical Activity:   . Days of Exercise per Week:   . Minutes of Exercise per Session:   Stress:   . Feeling of Stress :   Social Connections:   . Frequency of Communication with Friends and Family:   . Frequency of Social Gatherings with Friends and Family:   . Attends Religious Services:   . Active Member of Clubs or Organizations:   . Attends Archivist Meetings:   Marland Kitchen Marital Status:   Intimate Partner Violence:   . Fear of Current or Ex-Partner:   . Emotionally Abused:   Marland Kitchen Physically Abused:   . Sexually Abused:    Family History  Problem Relation Age of Onset  . Heart attack Brother   . Heart disease Brother   . Hyperlipidemia Mother   . Hypertension Father        Review of Systems  Constitutional: Positive for unexpected weight change. Negative for fever.  HENT: Positive for rhinorrhea. Negative for congestion, dental problem, ear pain, nosebleeds, postnasal drip, sinus pressure, sneezing, sore throat and trouble swallowing.   Eyes: Negative for redness and itching.  Respiratory: Negative  for cough, chest tightness, shortness of breath and wheezing.   Cardiovascular: Negative for palpitations and leg swelling.  Gastrointestinal: Negative for nausea and vomiting.  Genitourinary: Negative for dysuria.  Musculoskeletal: Positive for joint swelling.  Skin: Negative for rash.  Allergic/Immunologic: Negative.  Negative for environmental allergies, food allergies and immunocompromised state.  Neurological: Negative for headaches.  Hematological: Does not bruise/bleed easily.  Psychiatric/Behavioral: Negative for dysphoric mood. The patient is nervous/anxious.        Objective:   Physical Exam HENT:     Head: Normocephalic and atraumatic.     Nose: Nose normal.     Mouth/Throat:     Mouth: Mucous membranes are moist.  Eyes:     Extraocular Movements: Extraocular movements intact.     Pupils: Pupils are equal, round, and  reactive to light.  Cardiovascular:     Rate and Rhythm: Normal rate and regular rhythm.     Pulses: Normal pulses.     Heart sounds: No murmur. No friction rub.  Pulmonary:     Effort: Pulmonary effort is normal. No respiratory distress.     Breath sounds: Normal breath sounds. No stridor. No wheezing or rhonchi.  Musculoskeletal:        General: Normal range of motion.     Cervical back: Normal range of motion and neck supple. No rigidity or tenderness.  Skin:    General: Skin is warm.  Neurological:     General: No focal deficit present.     Mental Status: He is alert.    Vitals:   01/27/20 1138  BP: 128/68  Pulse: 61  Temp: (!) 97.1 F (36.2 C)  SpO2: 97%   Results of the Epworth flowsheet 01/27/2020  Sitting and reading 0  Watching TV 1  Sitting, inactive in a public place (e.g. a theatre or a meeting) 0  As a passenger in a car for an hour without a break 0  Lying down to rest in the afternoon when circumstances permit 1  Sitting and talking to someone 0  Sitting quietly after a lunch without alcohol 0  In a car, while stopped for a few minutes in traffic 0  Total score 2      Assessment & Plan:  .  Sleep onset and sleep maintenance insomnia .  Poor sleep hygiene  .  Importance of obtaining consolidated sleep was discussed  .  Avoidance of daytime naps discussed .  Regular exercises  Trial with Sonata 5 mg nightly  .  I will see him in about 6 weeks  .  Encouraged to call with any significant concerns .  Encouraged to limit bedtime hours to 6 to 8 hours at most, avoid daytime naps

## 2020-01-27 NOTE — Patient Instructions (Addendum)
Sleep onset and sleep maintenance insomnia  Trial with Sonata  Try and consolidate your sleep to 6 to 8 hours  Avoid daytime naps as able  If you laying in bed for too long-this will continue to contribute to disruption of your sleep  I will see you in about 6 weeks

## 2020-01-29 ENCOUNTER — Other Ambulatory Visit: Payer: Medicare Other

## 2020-02-02 ENCOUNTER — Other Ambulatory Visit: Payer: Self-pay | Admitting: Family Medicine

## 2020-02-02 ENCOUNTER — Other Ambulatory Visit: Payer: Self-pay

## 2020-02-02 ENCOUNTER — Other Ambulatory Visit (INDEPENDENT_AMBULATORY_CARE_PROVIDER_SITE_OTHER): Payer: Medicare Other

## 2020-02-02 DIAGNOSIS — E1165 Type 2 diabetes mellitus with hyperglycemia: Secondary | ICD-10-CM

## 2020-02-02 LAB — HEMOGLOBIN A1C: Hgb A1c MFr Bld: 8.3 % — ABNORMAL HIGH (ref 4.6–6.5)

## 2020-02-02 MED ORDER — GLIPIZIDE ER 5 MG PO TB24: 10.0000 mg | ORAL_TABLET | Freq: Every day | ORAL | 2 refills | Status: DC

## 2020-02-04 ENCOUNTER — Telehealth: Payer: Self-pay

## 2020-02-04 ENCOUNTER — Telehealth: Payer: Self-pay | Admitting: Family Medicine

## 2020-02-04 NOTE — Telephone Encounter (Signed)
See result notes. 

## 2020-02-04 NOTE — Telephone Encounter (Signed)
Patient wife called returning miss call from [place] . Would like a call back [phone]

## 2020-02-08 ENCOUNTER — Ambulatory Visit: Payer: Medicare Other | Admitting: Cardiology

## 2020-02-09 ENCOUNTER — Telehealth: Payer: Self-pay | Admitting: Cardiology

## 2020-02-09 NOTE — Telephone Encounter (Signed)
Returned call to wife (ok per DPR)-she is requesting to come with patient to appt on 4/8.  States patient has memory issues and cannot remember what the MD discusses.   Advised ok to come with patient.   Noted in appt note.

## 2020-02-09 NOTE — Telephone Encounter (Signed)
New Message   Pts wife is calling and says she will need to assist the pt to his appt because the pt doesn't remember things   Please advise

## 2020-02-10 ENCOUNTER — Encounter: Payer: Self-pay | Admitting: Dietician

## 2020-02-10 ENCOUNTER — Other Ambulatory Visit: Payer: Self-pay

## 2020-02-10 ENCOUNTER — Encounter: Payer: Medicare Other | Attending: Family Medicine | Admitting: Dietician

## 2020-02-10 DIAGNOSIS — E119 Type 2 diabetes mellitus without complications: Secondary | ICD-10-CM | POA: Insufficient documentation

## 2020-02-10 NOTE — Progress Notes (Signed)
Diabetes Self-Management Education  Visit Type: First/Initial  02/10/2020  [name], identified by name and date of birth, is a 69 y.o. male with a diagnosis of Diabetes: Type 2.   ASSESSMENT  Weight 202 lb (91.6 kg). Body mass index is 27.4 kg/m.   Patient states his fasting BG averages in the 130s (usually between 120-150.)  Does not care for pasta or [place] foods in general. Has a sweet tooth, likes jello and cookies especially. Likes starchy foods including potatoes, corn, and bread. Would like to lose some weight and bring A1c down.   Diabetes Self-Management Education - 02/10/20 1112      Visit Information   Visit Type  First/Initial      Initial Visit   Diabetes Type  Type 2    Are you currently following a meal plan?  No    Are you taking your medications as prescribed?  Yes    Date Diagnosed  [place]   How would you rate your overall health?  Fair      Psychosocial Assessment   Patient Belief/Attitude about Diabetes  Motivated to manage diabetes    Self-care barriers  Other (comment)   no internet access   Other persons present  Patient;Spouse/SO    Patient Concerns  Nutrition/Meal planning;Weight Control    Special Needs  None    Preferred Learning Style  No preference indicated    Learning Readiness  Ready    How often do you need to have someone help you when you read instructions, pamphlets, or other written materials from your doctor or pharmacy?  1 - Never    What is the last grade level you completed in school?  high school      Complications   Last HgB A1C per patient/outside source  8.3 %   02/02/2020   How often do you check your blood sugar?  1-2 times/day    Fasting Blood glucose range (mg/dL)  130-179;70-129    Have you had a dilated eye exam in the past 12 months?  Yes    Have you had a dental exam in the past 12 months?  Yes    Are you checking your feet?  No      Dietary Intake   Breakfast  oatmeal + eggs + bacon +  toast + hashbrowns + sausage + gravy biscuits    Snack (morning)  Lance peanut butter crackers + 8 oz soda    Lunch  salad + crackers   usually skips- eats late breakfast   Snack (afternoon)  sugar-free jello    Dinner  pork loin + corn + potatoes    Snack (evening)  oatmeal cookies    Beverage(s)  soda, unsweetened tea, water      Exercise   Exercise Type  ADL's;Light (walking / raking leaves)    How many days per week to you exercise?  4    How many minutes per day do you exercise?  60    Total minutes per week of exercise  240      Patient Education   Previous Diabetes Education  Yes (please comment)   when diagnosed in 1998   Disease state   Explored patient's options for treatment of their diabetes    Nutrition management   Role of diet in the treatment of diabetes and the relationship between the three main macronutrients and blood glucose level;Food label reading, portion sizes and measuring food.;Carbohydrate  counting      Individualized Goals (developed by patient)   Nutrition  Follow meal plan discussed    Medications  take my medication as prescribed    Monitoring   test my blood glucose as discussed   continue checking fasting daily     Outcomes   Expected Outcomes  Demonstrated interest in learning. Expect positive outcomes    Future DMSE  4-6 wks       Individualized Plan for Diabetes Self-Management Training:   Learning Objective:  Patient will have a greater understanding of diabetes self-management. Patient education plan is to attend individual and/or group sessions per assessed needs and concerns.   Plan:  Patient Instructions  Aim to drink at least 4 bottles of water per day.   Try some of these brands:   Nuun   Amazing Grass   Tomma Rakers Sugar-Free   Whitman's Sugar-Free  Keep carbohydrates to 1/4 of your meal.   Always eat a source of protein with all your meals and snacks.    Expected Outcomes:  Demonstrated interest in learning.  Expect positive outcomes  Education material provided: My Plate and Snack sheet, Types of Sugar, Meal Ideas, Types of Diabetes & BG Control, Breakfast Ideas, Sugary Beverages   If problems or questions, patient to contact team via:  Phone and Email  Future DSME appointment: 4-6 wks

## 2020-02-10 NOTE — Patient Instructions (Addendum)
Aim to drink at least 4 bottles of water per day.   Try some of these brands:   Nuun   Amazing Grass   Tomma Rakers Sugar-Free   Whitman's Sugar-Free  Keep carbohydrates to 1/4 of your meal.   Always eat a source of protein with all your meals and snacks.

## 2020-02-11 ENCOUNTER — Ambulatory Visit (INDEPENDENT_AMBULATORY_CARE_PROVIDER_SITE_OTHER): Payer: Medicare Other | Admitting: Cardiology

## 2020-02-11 ENCOUNTER — Encounter: Payer: Self-pay | Admitting: Cardiology

## 2020-02-11 VITALS — BP 120/70 | HR 68 | Ht 72.0 in | Wt 203.0 lb

## 2020-02-11 DIAGNOSIS — I10 Essential (primary) hypertension: Secondary | ICD-10-CM | POA: Diagnosis not present

## 2020-02-11 DIAGNOSIS — I2510 Atherosclerotic heart disease of native coronary artery without angina pectoris: Secondary | ICD-10-CM | POA: Diagnosis not present

## 2020-02-11 DIAGNOSIS — E782 Mixed hyperlipidemia: Secondary | ICD-10-CM

## 2020-02-11 DIAGNOSIS — E088 Diabetes mellitus due to underlying condition with unspecified complications: Secondary | ICD-10-CM | POA: Diagnosis not present

## 2020-02-11 MED ORDER — NITROGLYCERIN 0.4 MG SL SUBL: 0.4000 mg | SUBLINGUAL_TABLET | SUBLINGUAL | 1 refills | Status: DC | PRN

## 2020-02-11 NOTE — Patient Instructions (Signed)
Medication Instructions:  No medication changes *If you need a refill on your cardiac medications before your next appointment, please call your pharmacy*   Lab Work: Your physician recommends that you return for lab work in: the next few days. You need to have labs done when you are fasting.  You can come Monday through Friday 8:30 am to 12:00 pm and 1:15 to 4:30. You do not need to make an appointment as the order has already been placed. The labs you are going to have done are BMET, LFT and Lipids.  If you have labs (blood work) drawn today and your tests are completely normal, you will receive your results only by: Marland Kitchen MyChart Message (if you have MyChart) OR . A paper copy in the mail If you have any lab test that is abnormal or we need to change your treatment, we will call you to review the results.   Testing/Procedures: Your physician has requested that you have an echocardiogram. Echocardiography is a painless test that uses sound waves to create images of your heart. It provides your doctor with information about the size and shape of your heart and how well your heart's chambers and valves are working. This procedure takes approximately one hour. There are no restrictions for this procedure. Your physician has requested that you have a lexiscan myoview. For further information please visit [email or web address]. Please follow instruction sheet, as given.  Your physician has requested that you have a lexiscan myoview. For further information please visit [email or web address]. Please follow instruction sheet, as given.  The test will take approximately 3 to 4 hours to complete; you may bring reading material.  If someone comes with you to your appointment, they will need to remain in the main lobby due to limited space in the testing area. **If you are pregnant or breastfeeding, please notify the nuclear lab prior to your appointment**  How to prepare for your Myocardial Perfusion  Test: . Do not eat or drink 3 hours prior to your test, except you may have water. . Do not consume products containing caffeine (regular or decaffeinated) 12 hours prior to your test. (ex: coffee, chocolate, sodas, tea). . Do bring a list of your current medications with you.  If not listed below, you may take your medications as normal. . Do wear comfortable clothes (no dresses or overalls) and walking shoes, tennis shoes preferred (No heels or open toe shoes are allowed). . Do NOT wear cologne, perfume, aftershave, or lotions (deodorant is allowed). . If these instructions are not followed, your test will have to be rescheduled.  Do not take carvedilol (Coreg) for 24 hours prior to the test.  Bring the medication to your appointment as you may be required to take it once the test is complete.   Follow-Up: At [facility], you and your health needs are our priority.  As part of our continuing mission to provide you with exceptional heart care, we have created designated Provider Care Teams.  These Care Teams include your primary Cardiologist (physician) and Advanced Practice Providers (APPs -  Physician Assistants and Nurse Practitioners) who all work together to provide you with the care you need, when you need it.  We recommend signing up for the patient portal called "MyChart".  Sign up information is provided on this After Visit Summary.  MyChart is used to connect with patients for Virtual Visits (Telemedicine).  Patients are able to view lab/test results, encounter notes, upcoming appointments, etc.  Non-urgent messages can be  sent to your provider as well.   To learn more about what you can do with MyChart, go to [email or web address].    Your next appointment:   6 month(s)  The format for your next appointment:   In Person  Provider:   Jyl Heinz, MD   Other Instructions NA

## 2020-02-11 NOTE — Progress Notes (Signed)
Cardiology Office Note:    Date:  02/11/2020   ID:  Gary Barnett, DOB 02-14-1951, MRN [id number]  PCP:  Shelda Pal, DO  Cardiologist:  Jenean Lindau, MD   Referring MD: Shelda Pal*    ASSESSMENT:    1. Coronary artery disease involving native coronary artery of native heart without angina pectoris   2. Essential hypertension   3. Mixed hyperlipidemia   4. Diabetes mellitus due to underlying condition with unspecified complications ([place])    PLAN:    In order of problems listed above:  1. Coronary artery disease: Secondary prevention stressed with the patient.  Importance of compliance with diet and medication stressed and he vocalized understanding.  He walks on a regular basis.  Because of some issues with shortness of breath and diet being a diabetic he wants to be evaluated for obstructive coronary artery disease.  Will order a Lexiscan sestamibi.  EKG done today reveals sinus rhythm and nonspecific ST-T changes. 2. Essential hypertension: Blood pressure is stable.  Echocardiogram will be done to assess murmur heard on auscultation. 3. Mixed dyslipidemia and diabetes mellitus: Diet was emphasized.  I would prefer to see the patient on a newer statin such as atorvastatin or rosuvastatin per current guidelines and therefore he will be back in the next few days for Chem-7 liver lipid check and I will change his statin accordingly. 4. Patient will be seen in follow-up appointment in 6 months or earlier if the patient has any concerns    Medication Adjustments/Labs and Tests Ordered: Current medicines are reviewed at length with the patient today.  Concerns regarding medicines are outlined above.  No orders of the defined types were placed in this encounter.  No orders of the defined types were placed in this encounter.    Chief Complaint  Patient presents with  . Follow-up     History of Present Illness:    Gary Barnett is a 69 y.o. male.   Patient has past medical history of coronary artery disease, essential hypertension dyslipidemia and diabetes mellitus.  He denies any problems at this time and takes care of activities of daily living.  No chest pain orthopnea or PND.  He occasionally has dyspnea on exertion.  At the time of my evaluation, the patient is alert awake oriented and in no distress.  Past Medical History:  Diagnosis Date  . Diabetes mellitus without complication ([place])   . Hyperlipidemia   . Hypertension     Past Surgical History:  Procedure Laterality Date  . CORONARY STENT PLACEMENT     Thinks left main, around age 28    Current Medications: Current Meds  Medication Sig  . Accu-Chek Softclix Lancets lancets Use twice daily to check blood sugar  . Alcohol Swabs (B-D SINGLE USE SWABS REGULAR) PADS Use daily when checking blood sugar  . aspirin 325 MG tablet Take 325 mg by mouth.  . carvedilol (COREG) 25 MG tablet Take 0.5 tablets (12.5 mg total) by mouth 2 (two) times daily with a meal.  . ELDERBERRY PO Take by mouth.  Marland Kitchen glipiZIDE (GLUCOTROL XL) 5 MG 24 hr tablet Take 2 tablets (10 mg total) by mouth daily with breakfast.  . glucose blood (ACCU-CHEK AVIVA PLUS) test strip Test twice daily for blood sugar  . levocetirizine (XYZAL) 5 MG tablet Take 1 tablet (5 mg total) by mouth every evening.  Marland Kitchen lisinopril (ZESTRIL) 5 MG tablet Take 1 tablet (5 mg total) by mouth daily.  . metFORMIN (  GLUCOPHAGE) 1000 MG tablet Take 1 tablet (1,000 mg total) by mouth 2 (two) times daily with a meal.  . mirtazapine (REMERON) 30 MG tablet Take 1 tablet (30 mg total) by mouth at bedtime.  . niacin 500 MG tablet Take 500 mg by mouth daily.   . nitroGLYCERIN (NITROSTAT) 0.4 MG SL tablet Place 1 tablet (0.4 mg total) under the tongue every 5 (five) minutes x 3 doses as needed for chest pain.  . pravastatin (PRAVACHOL) 40 MG tablet Take 1 tablet (40 mg total) by mouth daily.  . sertraline (ZOLOFT) 50 MG tablet Take 1 tablet (50 mg  total) by mouth daily.  . vitamin C (ASCORBIC ACID) 500 MG tablet Take 500 mg by mouth 2 (two) times daily.  . zaleplon (SONATA) 5 MG capsule Take 1 capsule (5 mg total) by mouth at bedtime as needed for sleep.     Allergies:   Clopidogrel   Social History   Socioeconomic History  . Marital status: Married    Spouse name: Not on file  . Number of children: Not on file  . Years of education: Not on file  . Highest education level: Not on file  Occupational History  . Not on file  Tobacco Use  . Smoking status: Former Smoker    Packs/day: 1.00    Years: 25.00    Pack years: 25.00    Types: Cigarettes  . Smokeless tobacco: Never Used  Substance and Sexual Activity  . Alcohol use: No  . Drug use: No  . Sexual activity: Not on file  Other Topics Concern  . Not on file  Social History Narrative  . Not on file   Social Determinants of Health   Financial Resource Strain:   . Difficulty of Paying Living Expenses:   Food Insecurity:   . Worried About Charity fundraiser in the Last Year:   . Arboriculturist in the Last Year:   Transportation Needs:   . Film/video editor (Medical):   Marland Kitchen Lack of Transportation (Non-Medical):   Physical Activity:   . Days of Exercise per Week:   . Minutes of Exercise per Session:   Stress:   . Feeling of Stress :   Social Connections:   . Frequency of Communication with Friends and Family:   . Frequency of Social Gatherings with Friends and Family:   . Attends Religious Services:   . Active Member of Clubs or Organizations:   . Attends Archivist Meetings:   Marland Kitchen Marital Status:      Family History: The patient's family history includes Heart attack in his brother; Heart disease in his brother; Hyperlipidemia in his mother; Hypertension in his father.  ROS:   Please see the history of present illness.    All other systems reviewed and are negative.  EKGs/Labs/Other Studies Reviewed:    The following studies were  reviewed today: EKG done today reveals sinus rhythm and nonspecific ST-T changes.   Recent Labs: 06/03/2019: ALT 25; BUN 14; Creatinine, Ser 0.81; Hemoglobin 14.7; Platelets 214.0; Potassium 4.4; Sodium 139  Recent Lipid Panel    Component Value Date/Time   CHOL 122 06/03/2019 1330   TRIG 155.0 (H) 06/03/2019 1330   HDL 34.10 (L) 06/03/2019 1330   CHOLHDL 4 06/03/2019 1330   VLDL 31.0 06/03/2019 1330   LDLCALC 57 06/03/2019 1330    Physical Exam:    VS:  BP 120/70   Pulse 68   Ht 6' (1.829 m)  Wt 203 lb (92.1 kg)   SpO2 98%   BMI 27.53 kg/m     Wt Readings from Last 3 Encounters:  02/11/20 203 lb (92.1 kg)  02/10/20 202 lb (91.6 kg)  01/27/20 202 lb 3.2 oz (91.7 kg)     GEN: Patient is in no acute distress HEENT: Normal NECK: No JVD; No carotid bruits LYMPHATICS: No lymphadenopathy CARDIAC: Hear sounds regular, 2/6 systolic murmur at the apex. RESPIRATORY:  Clear to auscultation without rales, wheezing or rhonchi  ABDOMEN: Soft, non-tender, non-distended MUSCULOSKELETAL:  No edema; No deformity  SKIN: Warm and dry NEUROLOGIC:  Alert and oriented x 3 PSYCHIATRIC:  Normal affect   Signed, Jenean Lindau, MD  02/11/2020 4:25 PM    [facility]

## 2020-02-12 ENCOUNTER — Ambulatory Visit: Payer: Medicare Other | Admitting: Cardiology

## 2020-02-15 ENCOUNTER — Ambulatory Visit (HOSPITAL_BASED_OUTPATIENT_CLINIC_OR_DEPARTMENT_OTHER): Payer: Medicare Other

## 2020-02-15 ENCOUNTER — Telehealth (HOSPITAL_COMMUNITY): Payer: Self-pay

## 2020-02-15 NOTE — Telephone Encounter (Signed)
Detailed instructions left on the patient's answering machine. Asked to call back with any questions. S.Mataeo Ingwersen EMTP 

## 2020-02-16 ENCOUNTER — Other Ambulatory Visit: Payer: Self-pay

## 2020-02-16 ENCOUNTER — Ambulatory Visit (HOSPITAL_BASED_OUTPATIENT_CLINIC_OR_DEPARTMENT_OTHER)
Admission: RE | Admit: 2020-02-16 | Discharge: 2020-02-16 | Disposition: A | Discharge status: Home / Self Care | Payer: Medicare Other | Source: Ambulatory Visit | Attending: Cardiology | Admitting: Cardiology

## 2020-02-16 DIAGNOSIS — E088 Diabetes mellitus due to underlying condition with unspecified complications: Secondary | ICD-10-CM | POA: Diagnosis not present

## 2020-02-16 DIAGNOSIS — I2510 Atherosclerotic heart disease of native coronary artery without angina pectoris: Secondary | ICD-10-CM

## 2020-02-16 DIAGNOSIS — E782 Mixed hyperlipidemia: Secondary | ICD-10-CM | POA: Diagnosis not present

## 2020-02-16 DIAGNOSIS — I10 Essential (primary) hypertension: Secondary | ICD-10-CM | POA: Diagnosis not present

## 2020-02-16 MED ORDER — PERFLUTREN LIPID MICROSPHERE
1.0000 mL | INTRAVENOUS | Status: AC | PRN
  Administered 2020-02-16: 2 mL via INTRAVENOUS
  Filled 2020-02-16: qty 10

## 2020-02-16 NOTE — Progress Notes (Signed)
  Echocardiogram 2D Echocardiogram has been performed.   Cardell Peach 02/16/2020, 11:36 AM

## 2020-02-17 ENCOUNTER — Telehealth: Payer: Self-pay | Admitting: Emergency Medicine

## 2020-02-17 LAB — HEPATIC FUNCTION PANEL
ALT: 24 IU/L (ref 0–44)
AST: 21 IU/L (ref 0–40)
Albumin: 4.8 g/dL (ref 3.8–4.8)
Alkaline Phosphatase: 111 IU/L (ref 39–117)
Bilirubin Total: 0.7 mg/dL (ref 0.0–1.2)
Bilirubin, Direct: 0.17 mg/dL (ref 0.00–0.40)
Total Protein: 7.1 g/dL (ref 6.0–8.5)

## 2020-02-17 LAB — LIPID PANEL
Chol/HDL Ratio: 5 ratio (ref 0.0–5.0)
Cholesterol, Total: 149 mg/dL (ref 100–199)
HDL: 30 mg/dL — ABNORMAL LOW (ref >39)
LDL Chol Calc (NIH): 87 mg/dL (ref 0–99)
Triglycerides: 188 mg/dL — ABNORMAL HIGH (ref 0–149)
VLDL Cholesterol Cal: 32 mg/dL (ref 5–40)

## 2020-02-17 LAB — BASIC METABOLIC PANEL
BUN/Creatinine Ratio: 17 (ref 10–24)
BUN: 15 mg/dL (ref 8–27)
CO2: 22 mmol/L (ref 20–29)
Calcium: 9.2 mg/dL (ref 8.6–10.2)
Chloride: 101 mmol/L (ref 96–106)
Creatinine, Ser: 0.86 mg/dL (ref 0.76–1.27)
GFR calc Af Amer: 104 mL/min/{1.73_m2} (ref >59)
GFR calc non Af Amer: 90 mL/min/{1.73_m2} (ref >59)
Glucose: 130 mg/dL — ABNORMAL HIGH (ref 65–99)
Potassium: 4.5 mmol/L (ref 3.5–5.2)
Sodium: 141 mmol/L (ref 134–144)

## 2020-02-17 NOTE — Telephone Encounter (Signed)
Left message for patient to return call.

## 2020-02-18 ENCOUNTER — Other Ambulatory Visit: Payer: Self-pay

## 2020-02-18 ENCOUNTER — Ambulatory Visit (HOSPITAL_COMMUNITY): Payer: Medicare Other | Attending: Cardiovascular Disease

## 2020-02-18 VITALS — Ht 72.0 in | Wt 203.0 lb

## 2020-02-18 DIAGNOSIS — I2510 Atherosclerotic heart disease of native coronary artery without angina pectoris: Secondary | ICD-10-CM | POA: Diagnosis not present

## 2020-02-18 LAB — MYOCARDIAL PERFUSION IMAGING
LV dias vol: 90 mL (ref 62–150)
LV sys vol: 45 mL
Peak HR: 93 {beats}/min
Rest HR: 66 {beats}/min
SDS: 1
SRS: 3
SSS: 5
TID: 1.14

## 2020-02-18 MED ORDER — TECHNETIUM TC 99M TETROFOSMIN IV KIT
31.2000 | PACK | Freq: Once | INTRAVENOUS | Status: AC | PRN
  Administered 2020-02-18: 31.2 via INTRAVENOUS
  Filled 2020-02-18: qty 32

## 2020-02-18 MED ORDER — REGADENOSON 0.4 MG/5ML IV SOLN
0.4000 mg | Freq: Once | INTRAVENOUS | Status: AC
  Administered 2020-02-18: 0.4 mg via INTRAVENOUS

## 2020-02-18 MED ORDER — TECHNETIUM TC 99M TETROFOSMIN IV KIT
10.4000 | PACK | Freq: Once | INTRAVENOUS | Status: AC | PRN
  Administered 2020-02-18: 10.4 via INTRAVENOUS
  Filled 2020-02-18: qty 11

## 2020-02-22 NOTE — Telephone Encounter (Signed)
LMTCB

## 2020-02-22 NOTE — Telephone Encounter (Signed)
Follow up   Patient's wife is returning call for results. Please call.

## 2020-02-23 NOTE — Telephone Encounter (Signed)
Left message for pt to call.

## 2020-02-25 NOTE — Telephone Encounter (Signed)
Patient's wife returning call. 

## 2020-02-29 NOTE — Telephone Encounter (Signed)
Follow Up  Alyus Gonia's wife on the line calling back about results

## 2020-02-29 NOTE — Telephone Encounter (Signed)
Called spoke to patients wife per dpr. Advised her off all recent test results and recommendations per Dr. Geraldo Pitter for recent lab results, echo results, and stress test results. No further questions.

## 2020-03-09 ENCOUNTER — Other Ambulatory Visit: Payer: Self-pay

## 2020-03-09 ENCOUNTER — Ambulatory Visit (INDEPENDENT_AMBULATORY_CARE_PROVIDER_SITE_OTHER): Payer: Medicare Other | Admitting: Family Medicine

## 2020-03-09 ENCOUNTER — Encounter: Payer: Self-pay | Admitting: Family Medicine

## 2020-03-09 VITALS — BP 112/80 | HR 64 | Temp 97.0°F | Ht 71.0 in | Wt 197.1 lb

## 2020-03-09 DIAGNOSIS — F411 Generalized anxiety disorder: Secondary | ICD-10-CM

## 2020-03-09 DIAGNOSIS — E1165 Type 2 diabetes mellitus with hyperglycemia: Secondary | ICD-10-CM

## 2020-03-09 MED ORDER — ATORVASTATIN CALCIUM 80 MG PO TABS: 80.0000 mg | ORAL_TABLET | Freq: Every day | ORAL | 3 refills | Status: DC

## 2020-03-09 MED ORDER — CITALOPRAM HYDROBROMIDE 20 MG PO TABS: 20.0000 mg | ORAL_TABLET | Freq: Every day | ORAL | 2 refills | Status: DC

## 2020-03-09 MED ORDER — CITALOPRAM HYDROBROMIDE 20 MG PO TABS: 20.0000 mg | ORAL_TABLET | Freq: Every day | ORAL | 3 refills | Status: DC

## 2020-03-09 NOTE — Progress Notes (Signed)
Chief Complaint  Patient presents with  . Follow-up  . Anxiety    Subjective: Patient is a 69 y.o. male here for f/u.  GAD Anxiety levels have been higher since adjusting his routine for diabetes. He is on Zoloft 50 mg/d. Fam member on Celexa, interested in trying that. No SI or HI.    DM  Sugars have been running in the low 100's. Diet has been healthier. SU increased in dosage. Walking and staying active at home for exercise.   Past Medical History:  Diagnosis Date  . Diabetes mellitus without complication ([place])   . Hyperlipidemia   . Hypertension    Objective: BP 112/80 (BP Location: Left Arm, Patient Position: Sitting, Cuff Size: Normal)   Pulse 64   Temp (!) 97 F (36.1 C) (Temporal)   Ht 5\' 11"  (1.803 m)   Wt 197 lb 2 oz (89.4 kg)   SpO2 98%   BMI 27.49 kg/m  General: Awake, appears stated age HEENT: MMM, EOMi Heart: RRR, no murmurs Lungs: CTAB, no rales, wheezes or rhonchi. No accessory muscle use Psych: Age appropriate judgment and insight, normal affect and mood  Assessment and Plan: Type 2 diabetes mellitus with hyperglycemia, without long-term current use of insulin (HCC) - Plan: atorvastatin (LIPITOR) 80 MG tablet  GAD (generalized anxiety disorder) - Plan: citalopram (CELEXA) 20 MG tablet, Ambulatory referral to Psychiatry  1-Chronic, uncontrolled. Cont current care. Lifestyle mods.  2- Change Zoloft to Celexa. Refer psych. F/u in 1 mo. If still having sleep issues, will rx Klonopin prn until seeing psych.  The patient voiced understanding and agreement to the plan.  [place], DO 03/09/20  2:04 PM

## 2020-04-15 ENCOUNTER — Ambulatory Visit: Payer: Medicare Other | Admitting: Family Medicine

## 2020-05-04 ENCOUNTER — Other Ambulatory Visit: Payer: Self-pay | Admitting: Family Medicine

## 2020-05-04 ENCOUNTER — Encounter: Payer: Self-pay | Admitting: Family Medicine

## 2020-05-04 ENCOUNTER — Other Ambulatory Visit: Payer: Self-pay

## 2020-05-04 ENCOUNTER — Ambulatory Visit (INDEPENDENT_AMBULATORY_CARE_PROVIDER_SITE_OTHER): Payer: Medicare Other | Admitting: Family Medicine

## 2020-05-04 VITALS — BP 108/68 | HR 65 | Temp 95.4°F | Ht 72.0 in | Wt 196.4 lb

## 2020-05-04 DIAGNOSIS — G4700 Insomnia, unspecified: Secondary | ICD-10-CM

## 2020-05-04 DIAGNOSIS — E1165 Type 2 diabetes mellitus with hyperglycemia: Secondary | ICD-10-CM | POA: Diagnosis not present

## 2020-05-04 DIAGNOSIS — F411 Generalized anxiety disorder: Secondary | ICD-10-CM

## 2020-05-04 HISTORY — DX: Generalized anxiety disorder: F41.1

## 2020-05-04 LAB — HEMOGLOBIN A1C: Hgb A1c MFr Bld: 8.3 % — ABNORMAL HIGH (ref 4.6–6.5)

## 2020-05-04 MED ORDER — GLIPIZIDE ER 5 MG PO TB24: 10.0000 mg | ORAL_TABLET | Freq: Every day | ORAL | 2 refills | Status: DC

## 2020-05-04 MED ORDER — LEVOCETIRIZINE DIHYDROCHLORIDE 5 MG PO TABS: 5.0000 mg | ORAL_TABLET | Freq: Every evening | ORAL | 2 refills | Status: DC

## 2020-05-04 MED ORDER — CITALOPRAM HYDROBROMIDE 40 MG PO TABS: 40.0000 mg | ORAL_TABLET | Freq: Every day | ORAL | 2 refills | Status: DC

## 2020-05-04 MED ORDER — DAPAGLIFLOZIN PROPANEDIOL 10 MG PO TABS
10.0000 mg | ORAL_TABLET | Freq: Every day | ORAL | 3 refills | Status: DC
Start: 2020-05-04 — End: 2020-05-11

## 2020-05-04 MED ORDER — METFORMIN HCL 1000 MG PO TABS: 1000.0000 mg | ORAL_TABLET | Freq: Two times a day (BID) | ORAL | 2 refills | Status: DC

## 2020-05-04 NOTE — Progress Notes (Signed)
Chief Complaint  Patient presents with  . Follow-up    Subjective Gary Barnett presents for f/u anxiety/depression.  Pt is currently being treated with Celexa 20 mg/d, changed from Zoloft. Was requesting Klonopin for sleep. Referral to psych was placed.  Reports feeling about the same since treatment. No thoughts of harming self or others. No self-medication with alcohol, prescription drugs or illicit drugs. Pt is not following with a counselor/psychologist.  Sugars running in lower-mid hundreds. No lows. Compliant w meds. Diet is healthier.  Walking, wt resistance exercise, and elliptical for exercise. Takes metformin 1000 mg bid, glipizide 10 mg bid.   Past Medical History:  Diagnosis Date  . Diabetes mellitus without complication ([place])   . Hyperlipidemia   . Hypertension    Allergies as of 05/04/2020      Reactions   Clopidogrel       Medication List       Accurate as of May 04, 2020 12:00 PM. If you have any questions, ask your nurse or doctor.        Accu-Chek Aviva Plus test strip Generic drug: glucose blood Test twice daily for blood sugar   Accu-Chek Softclix Lancets lancets Use twice daily to check blood sugar   aspirin 325 MG tablet Take 325 mg by mouth.   atorvastatin 80 MG tablet Commonly known as: LIPITOR Take 1 tablet (80 mg total) by mouth daily.   B-D SINGLE USE SWABS REGULAR Pads Use daily when checking blood sugar   carvedilol 25 MG tablet Commonly known as: COREG Take 0.5 tablets (12.5 mg total) by mouth 2 (two) times daily with a meal.   citalopram 40 MG tablet Commonly known as: CELEXA Take 1 tablet (40 mg total) by mouth daily. What changed:   medication strength  how much to take Changed by: Shelda Pal, DO   ELDERBERRY PO Take by mouth.   glipiZIDE 5 MG 24 hr tablet Commonly known as: GLUCOTROL XL Take 2 tablets (10 mg total) by mouth daily with breakfast.   levocetirizine 5 MG tablet Commonly known as:  XYZAL Take 1 tablet (5 mg total) by mouth every evening.   lisinopril 5 MG tablet Commonly known as: ZESTRIL Take 1 tablet (5 mg total) by mouth daily.   metFORMIN 1000 MG tablet Commonly known as: GLUCOPHAGE Take 1 tablet (1,000 mg total) by mouth 2 (two) times daily with a meal.   nitroGLYCERIN 0.4 MG SL tablet Commonly known as: Nitrostat Place 1 tablet (0.4 mg total) under the tongue every 5 (five) minutes x 3 doses as needed for chest pain.   sertraline 50 MG tablet Commonly known as: ZOLOFT Take 1 tablet (50 mg total) by mouth daily.   vitamin C 500 MG tablet Commonly known as: ASCORBIC ACID Take 500 mg by mouth 2 (two) times daily.   zaleplon 5 MG capsule Commonly known as: Sonata Take 1 capsule (5 mg total) by mouth at bedtime as needed for sleep.       Exam BP 108/68 (BP Location: Left Arm, Patient Position: Sitting, Cuff Size: Normal)   Pulse 65   Temp (!) 95.4 F (35.2 C) (Temporal)   Ht 6' (1.829 m)   Wt 196 lb 6 oz (89.1 kg)   SpO2 98%   BMI 26.63 kg/m  General:  well developed, well nourished, in no apparent distress Lungs:  No respiratory distress Psych: well oriented with normal range of affect and age-appropriate judgement/insight, alert and oriented x4.  Assessment and Plan  GAD (generalized anxiety disorder) - Plan: citalopram (CELEXA) 40 MG tablet  Insomnia, unspecified type  Type 2 diabetes mellitus with hyperglycemia, without long-term current use of insulin (HCC) - Plan: metFORMIN (GLUCOPHAGE) 1000 MG tablet, glipiZIDE (GLUCOTROL XL) 5 MG 24 hr tablet, Hemoglobin A1c  Increase dosage of Celexa to 40 mg/d from 20 mg/d. Will ck in on psych referral. Self-referral resources provided in AVS today. Sounds like he is doing better with his sugars. Will ck labs today.  F/u as originally scheduled. The patient voiced understanding and agreement to the plan.  [place], DO 05/04/20 12:00 PM

## 2020-05-04 NOTE — Patient Instructions (Signed)
[facility] [address] [place], [place] [address] [phone]  [facility] [address] [place], [place] [address] [phone]  [facility] [place], [place] [address] [phone]  [facility] [address], [place], [place], #[phone] [address], [place], [place], [place]  [facility] [place], [place], [place], [place]  [facility] [place], [place]  Call one of these offices sooner than later as it can take 2-3 months to get a new patient appointment.   Give [place] 2-3 business days to get the results of your labs back.   Keep the diet clean and stay active.  Let us know if you need anything.

## 2020-05-05 ENCOUNTER — Encounter: Payer: Self-pay | Admitting: *Deleted

## 2020-05-06 ENCOUNTER — Ambulatory Visit: Payer: Medicare Other | Admitting: Family Medicine

## 2020-05-11 ENCOUNTER — Telehealth: Payer: Self-pay | Admitting: Family Medicine

## 2020-05-11 MED ORDER — ROSIGLITAZONE MALEATE 2 MG PO TABS
2.0000 mg | ORAL_TABLET | Freq: Every day | ORAL | 3 refills | Status: DC
Start: 2020-05-11 — End: 2020-05-13

## 2020-05-11 MED ORDER — PIOGLITAZONE HCL 30 MG PO TABS: 30.0000 mg | ORAL_TABLET | Freq: Every day | ORAL | 2 refills | Status: DC

## 2020-05-11 NOTE — Telephone Encounter (Signed)
That's not a side effect for that medication. I will send in Avandia. Ty.

## 2020-05-11 NOTE — Telephone Encounter (Signed)
It was probably [place]. Will send in Actos instead. TY.

## 2020-05-11 NOTE — Telephone Encounter (Signed)
Called left detailed message new prescription sent in.

## 2020-05-11 NOTE — Addendum Note (Signed)
Addended by: Ames Coupe on: 05/11/2020 04:39 PM   Modules accepted: Orders

## 2020-05-11 NOTE — Telephone Encounter (Signed)
Called the wife and she stated he has had actos before but it caused neuropathy. So try something else.

## 2020-05-11 NOTE — Telephone Encounter (Signed)
Caller: Gary Barnett Call back phone number: [phone]  Gary Barnett states they are not able to afford the last medication that it was sent to pharmacy for diabetes. (can't remember the name) She would like a call back.

## 2020-05-13 MED ORDER — SITAGLIPTIN PHOSPHATE 100 MG PO TABS
100.0000 mg | ORAL_TABLET | Freq: Every day | ORAL | 3 refills | Status: DC
Start: 2020-05-13 — End: 2020-07-07

## 2020-05-13 NOTE — Telephone Encounter (Signed)
The patient wife called in today. The patients average BS has been around 115 and 120 for the past 30 days. The patients wife wanted to know why is hgba1c was so high. He has made some drastic changes in diet/drinks. The Avandia was too expensive. The pharmacist stated the patients father had bladder cancer and pharmcist did not recommend Actos/or at the least let PCP know. She was mistaken that Actos did not cause neuropathy/but used to help neuropathy.

## 2020-05-13 NOTE — Telephone Encounter (Signed)
Called and spoke to the patients wife and they prefer a pill for now.

## 2020-05-13 NOTE — Addendum Note (Signed)
Addended by: Ames Coupe on: 05/13/2020 04:28 PM   Modules accepted: Orders

## 2020-05-13 NOTE — Telephone Encounter (Signed)
Sent. Ty.

## 2020-05-13 NOTE — Telephone Encounter (Signed)
We can try a weekly shot or another pill, cost may be an issue. May have to get him a [employer] payment card. Ty.

## 2020-05-13 NOTE — Addendum Note (Signed)
Addended by: Ames Coupe on: 05/13/2020 04:21 PM   Modules accepted: Orders

## 2020-05-16 ENCOUNTER — Telehealth: Payer: Self-pay | Admitting: Family Medicine

## 2020-05-16 MED ORDER — PIOGLITAZONE HCL 30 MG PO TABS: 30.0000 mg | ORAL_TABLET | Freq: Every day | ORAL | 2 refills | Status: DC

## 2020-05-16 NOTE — Telephone Encounter (Signed)
I am fine with it if he is. The study that implicated this found double the incidence of bladder cancer in the Actos arm of 2 compare to 1 in the control group, so I don't think it was significant. Insulin is another option, though this may be more pricy.

## 2020-05-16 NOTE — Telephone Encounter (Signed)
Called the patients wife and she said they would go ahead and try the Actos. Please send in 90 days to the [facility].

## 2020-05-16 NOTE — Telephone Encounter (Signed)
Called the patients wife and she wanted to know if it is ok for him to take the Actos. His father has bladder cancer and the pharmacist told him to let his MD know that as she thought not a good idea for him to take Actos. The wife stated Actos is only $4 so would work financially but they do not want him to take if not safe for him to take.

## 2020-05-16 NOTE — Addendum Note (Signed)
Addended by: Ames Coupe on: 05/16/2020 04:46 PM   Modules accepted: Orders

## 2020-05-16 NOTE — Telephone Encounter (Signed)
Caller: Simona Huh Call back phone number: [phone]  Want to discuss diabetes medication.

## 2020-06-20 ENCOUNTER — Telehealth: Payer: Self-pay | Admitting: Family Medicine

## 2020-06-20 NOTE — Telephone Encounter (Signed)
Caller Jonelle Sidle  Call Back # [phone]  Patient states he would like a referral to [facility].  Dr. Ila Mcgill   [facility]) [phone] Google reviews Podiatrist in [place], [place] COVID-19 info: [email or web address] Get online care: [email or web address] Address: [address], [place], [place] [address] Hours:  Open ? Closes 5PM Phone: [phone] Appointments: [email or web address]

## 2020-06-21 ENCOUNTER — Telehealth: Payer: Self-pay | Admitting: Family Medicine

## 2020-06-21 ENCOUNTER — Other Ambulatory Visit: Payer: Self-pay | Admitting: Family Medicine

## 2020-06-21 DIAGNOSIS — E1165 Type 2 diabetes mellitus with hyperglycemia: Secondary | ICD-10-CM

## 2020-06-21 MED ORDER — METOPROLOL SUCCINATE ER 50 MG PO TB24: 50.0000 mg | ORAL_TABLET | Freq: Every day | ORAL | 3 refills | Status: DC

## 2020-06-21 MED ORDER — ATORVASTATIN CALCIUM 80 MG PO TABS: 80.0000 mg | ORAL_TABLET | Freq: Every day | ORAL | 0 refills | Status: DC

## 2020-06-21 NOTE — Telephone Encounter (Signed)
Referral done

## 2020-06-21 NOTE — Telephone Encounter (Signed)
OK, for diabetes?

## 2020-06-21 NOTE — Telephone Encounter (Signed)
Caller:tamara  Patient states they call in for refill late now her husband doesn't have enough medicine. She understands they have to pay for this week of medicine out of pocket.  Can you call in a least a week of atorvastatin (LIPITOR) 80 MG tablet [[id number]] To  [id number], [address] AT [facility] [address]  [address] [place] [address], [place] [address]  Phone:  [phone] Fax:  [phone]    Insurance is no longer paying for carvedilol 25mg , she is wondering if you could send something similar to  [facility], [address], [address], [place], [place] [address]  Phone:  [phone] Fax:  [phone]

## 2020-06-21 NOTE — Telephone Encounter (Signed)
The patients wife states that carvedilol they will not pay for any longer. They would like an alternative. [facility] to [facility]  Sent in atorvastatin

## 2020-06-21 NOTE — Progress Notes (Unsigned)
am

## 2020-07-07 ENCOUNTER — Ambulatory Visit: Payer: Medicare Other | Admitting: Podiatry

## 2020-07-07 ENCOUNTER — Encounter: Payer: Self-pay | Admitting: Podiatry

## 2020-07-07 ENCOUNTER — Other Ambulatory Visit: Payer: Self-pay

## 2020-07-07 VITALS — BP 145/86 | HR 64 | Temp 97.3°F | Resp 16

## 2020-07-07 DIAGNOSIS — M79675 Pain in left toe(s): Secondary | ICD-10-CM | POA: Diagnosis not present

## 2020-07-07 DIAGNOSIS — B351 Tinea unguium: Secondary | ICD-10-CM | POA: Diagnosis not present

## 2020-07-08 NOTE — Progress Notes (Signed)
Subjective:   Patient ID: Gary Barnett, male   DOB: 69 y.o.   MRN: [id number]   HPI Patient presents with caregiver with concern about a thickened deformed big toenail left that has loosened in the distal portion and is becoming increasingly painful.  They are worried about fungus and other issues and patient does not smoke likes to be active   Review of Systems  All other systems reviewed and are negative.       Objective:  Physical Exam Vitals and nursing note reviewed.  Constitutional:      Appearance: He is well-developed.  Pulmonary:     Effort: Pulmonary effort is normal.  Musculoskeletal:        General: Normal range of motion.  Skin:    General: Skin is warm.  Neurological:     Mental Status: He is alert.     Neurovascular status was found to be intact muscle strength was found to be adequate.  I did note there to be a severely thickened dystrophic deformed hallux nail left that is painful when pressed with crumbling-like appearance underneath it and history of trauma several years ago.  Patient has good digital perfusion well oriented x3     Assessment:  Painful chronic nail disease left hallux that is most likely trauma with a secondary fungal infection     Plan:  H&P condition reviewed.  At this point I do think at nail removal may be necessary but also we can consider conservative treatment.  There are numerous questions I discussed the differences between oral topical and laser and today I went ahead and using sterile instrumentation to deep debridement of the nail flushed and cleaned it and we will start topical and the patient can commence soaks.  We again spent a great deal time going over some of these other treatments with the long-term permanent procedure that they had questions concerning

## 2020-07-18 ENCOUNTER — Encounter: Payer: Medicare Other | Admitting: Family Medicine

## 2020-07-19 IMAGING — US US ABDOMINAL AORTA SCREENING AAA
1 series · 14 of 25 positions shown · non-contrast
Comparison: None.

CLINICAL DATA: AAA screening history of smoking

EXAM:
US ABDOMINAL AORTA MEDICARE SCREENING
TECHNIQUE: Ultrasound examination of the abdominal aorta was performed as a
screening evaluation for abdominal aortic aneurysm.

[Series 1: us abdominal aorta screening aaa · 14 of 37 slices shown]
[im 1/37]
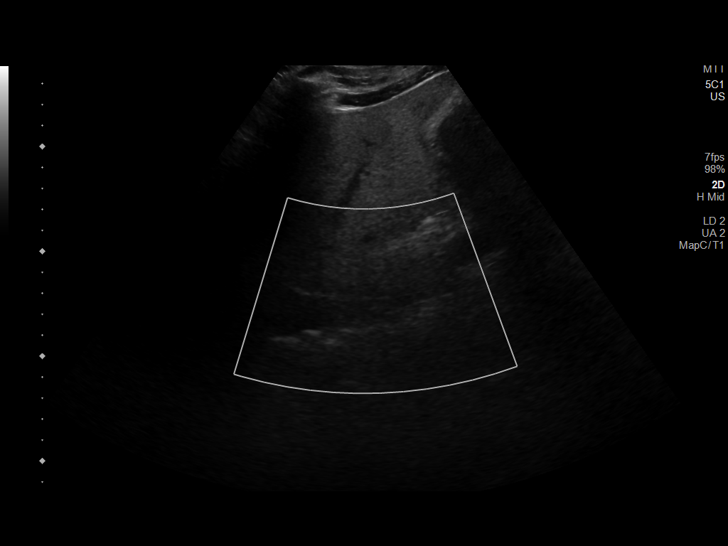
[im 4/37]
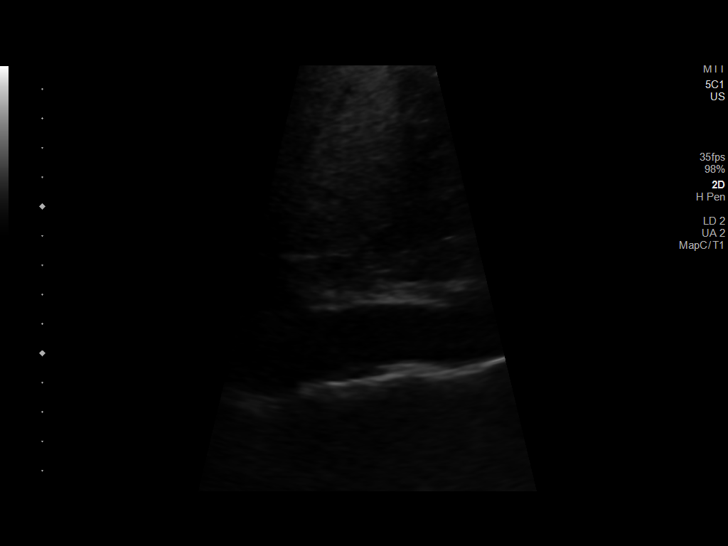
[im 7/37]
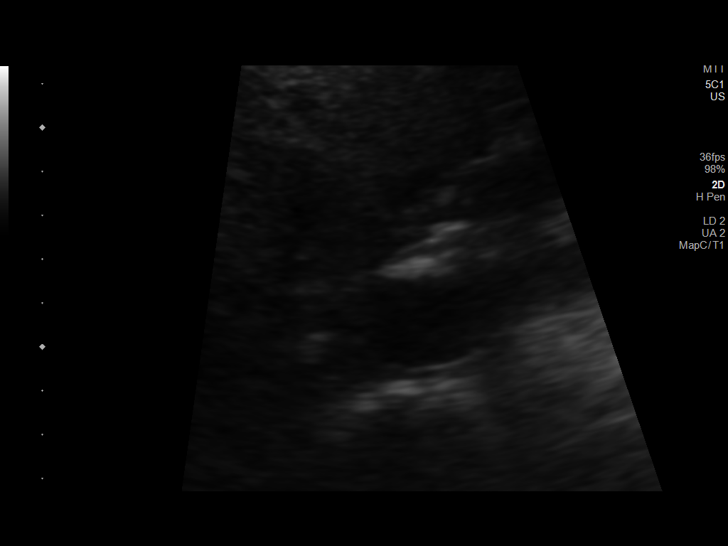
[im 10/37]
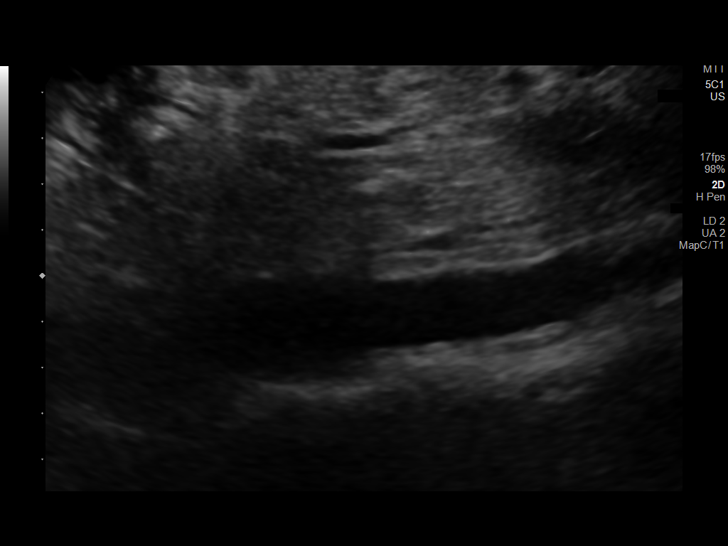
[im 13/37]
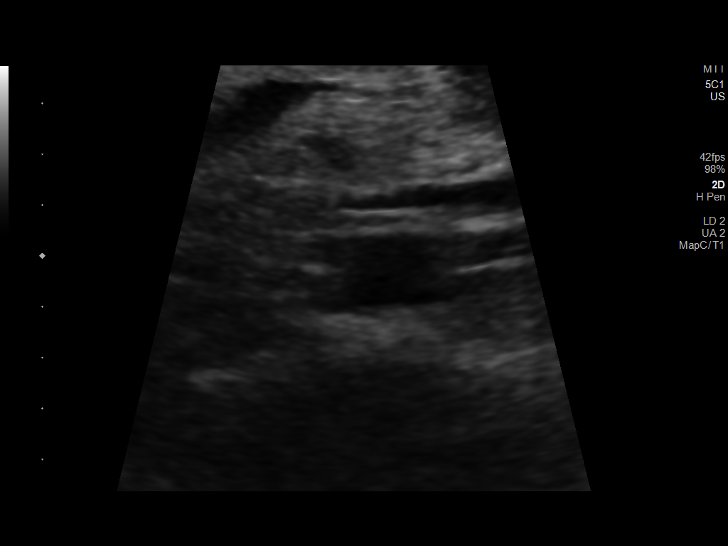
[im 14/37]
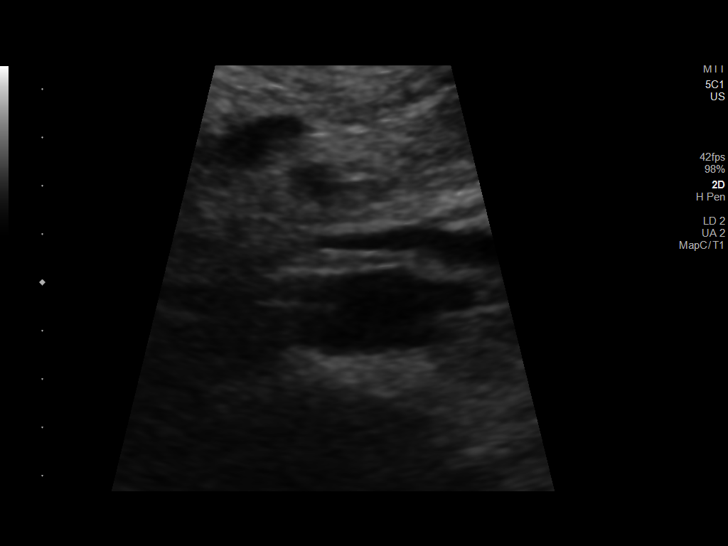
[im 17/37]
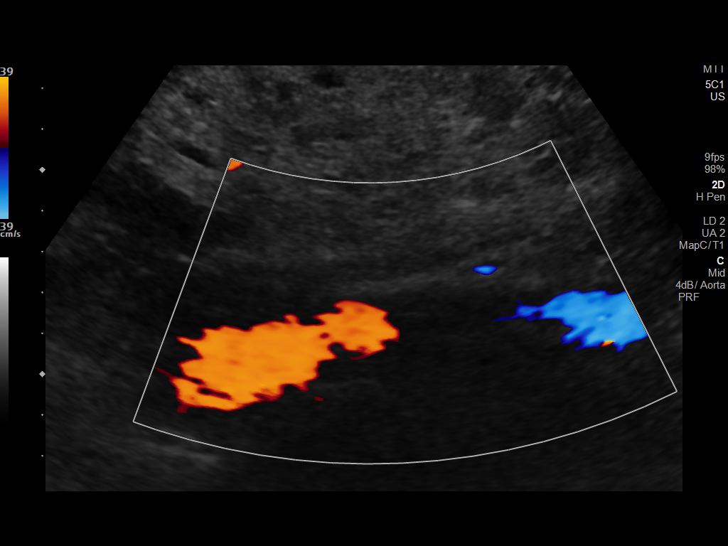
[im 20/37]
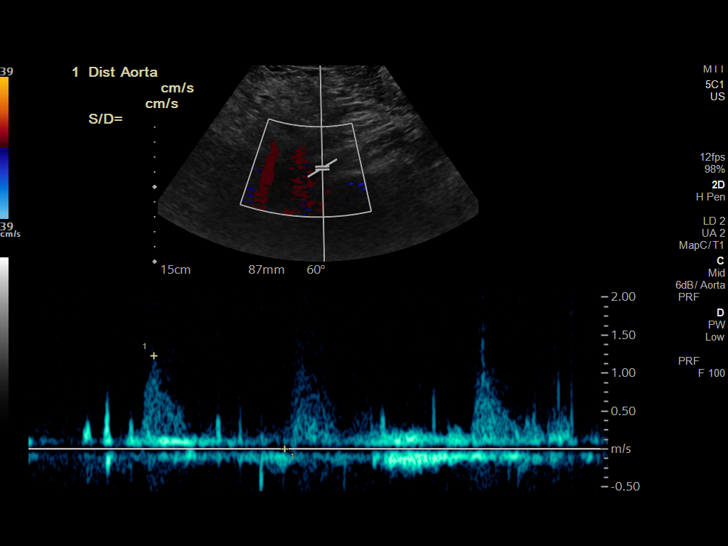
[im 23/37]
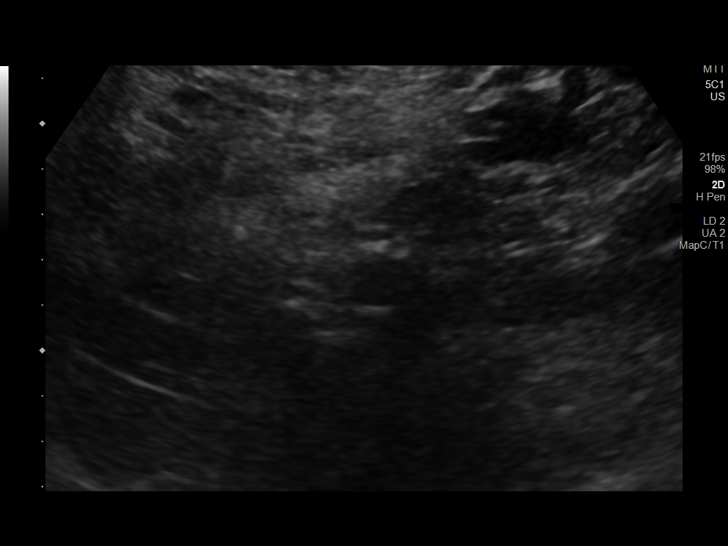
[im 25/37]
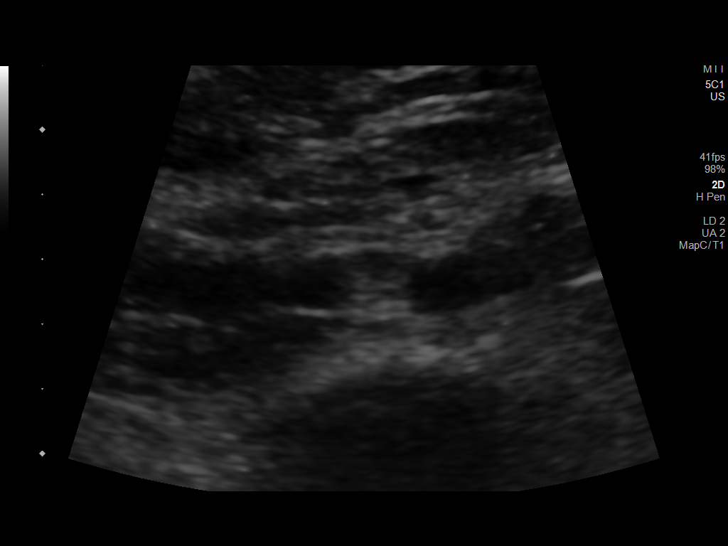
[im 28/37]
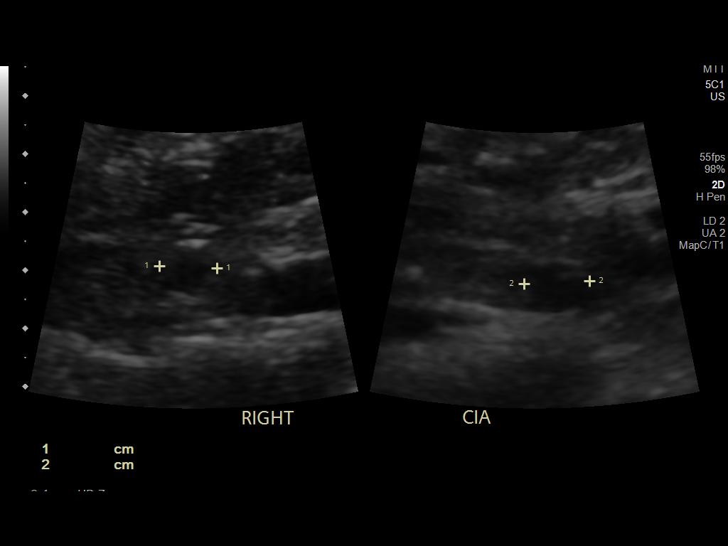
[im 31/37]
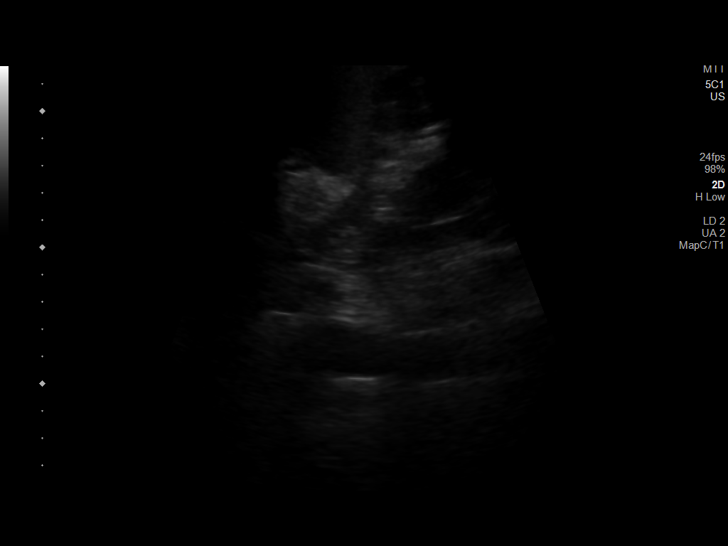
[im 34/37]
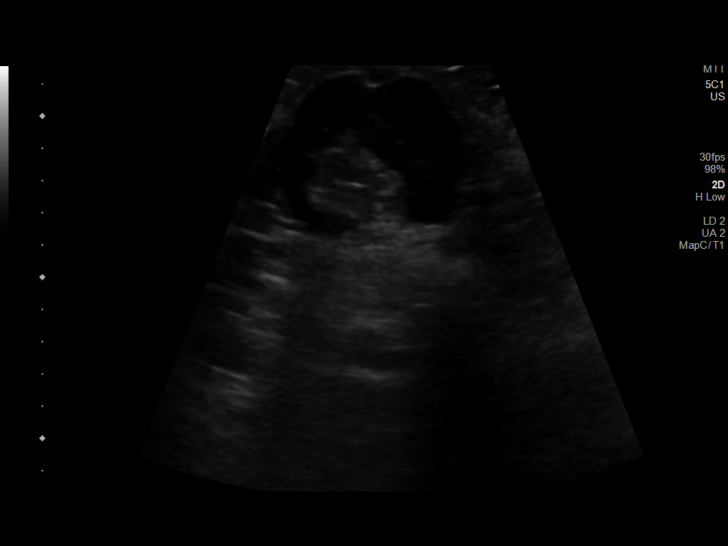
[im 37/37]
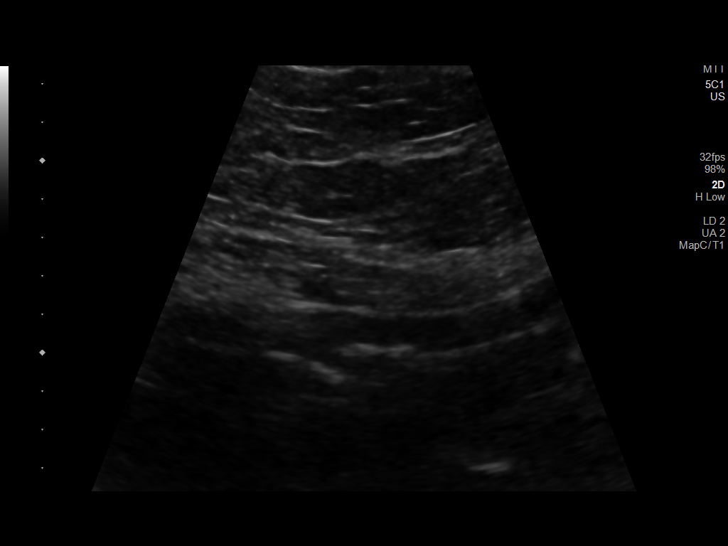

[14 of 25 positions shown; findings below may reference images not displayed]

FINDINGS: Abdominal aortic measurements as follows:

Proximal:  3.0 by 3.0 cm

Mid:  1.7 x 2.1 cm

Distal:  1.7 by 1.8 cm.

Study is limited by overlying bowel gas. There is mild
atherosclerotic irregularity of the aorta.
IMPRESSION: Mild aneurysmal dilation of the abdominal aorta, based on caliber, 3
year follow-up is suggested as outlined in ACR guidelines.

## 2020-07-22 ENCOUNTER — Ambulatory Visit: Payer: Medicare Other | Admitting: Podiatry

## 2020-07-25 ENCOUNTER — Ambulatory Visit: Payer: Medicare Other | Admitting: Podiatry

## 2020-08-07 ENCOUNTER — Other Ambulatory Visit: Payer: Self-pay | Admitting: Pulmonary Disease

## 2020-08-07 ENCOUNTER — Other Ambulatory Visit: Payer: Self-pay | Admitting: Family Medicine

## 2020-08-09 ENCOUNTER — Ambulatory Visit (INDEPENDENT_AMBULATORY_CARE_PROVIDER_SITE_OTHER): Payer: Medicare Other | Admitting: Family Medicine

## 2020-08-09 ENCOUNTER — Encounter: Payer: Self-pay | Admitting: Family Medicine

## 2020-08-09 ENCOUNTER — Other Ambulatory Visit: Payer: Self-pay

## 2020-08-09 VITALS — BP 110/64 | HR 55 | Temp 97.7°F | Ht 72.0 in | Wt 202.1 lb

## 2020-08-09 DIAGNOSIS — Z23 Encounter for immunization: Secondary | ICD-10-CM | POA: Diagnosis not present

## 2020-08-09 DIAGNOSIS — Z125 Encounter for screening for malignant neoplasm of prostate: Secondary | ICD-10-CM | POA: Diagnosis not present

## 2020-08-09 DIAGNOSIS — E1165 Type 2 diabetes mellitus with hyperglycemia: Secondary | ICD-10-CM

## 2020-08-09 DIAGNOSIS — Z0000 Encounter for general adult medical examination without abnormal findings: Secondary | ICD-10-CM

## 2020-08-09 DIAGNOSIS — F411 Generalized anxiety disorder: Secondary | ICD-10-CM

## 2020-08-09 MED ORDER — CITALOPRAM HYDROBROMIDE 40 MG PO TABS: 40.0000 mg | ORAL_TABLET | Freq: Every day | ORAL | 2 refills | Status: DC

## 2020-08-09 MED ORDER — METFORMIN HCL 1000 MG PO TABS: 1000.0000 mg | ORAL_TABLET | Freq: Two times a day (BID) | ORAL | 2 refills | Status: DC

## 2020-08-09 MED ORDER — ATORVASTATIN CALCIUM 80 MG PO TABS: 80.0000 mg | ORAL_TABLET | Freq: Every day | ORAL | 2 refills | Status: DC

## 2020-08-09 MED ORDER — LISINOPRIL 5 MG PO TABS
5.0000 mg | ORAL_TABLET | Freq: Every day | ORAL | 2 refills | Status: DC
Start: 2020-08-09 — End: 2021-03-08

## 2020-08-09 MED ORDER — GLIPIZIDE ER 5 MG PO TB24: 10.0000 mg | ORAL_TABLET | Freq: Every day | ORAL | 2 refills | Status: DC

## 2020-08-09 MED ORDER — PIOGLITAZONE HCL 30 MG PO TABS: ORAL_TABLET | ORAL | 1 refills | Status: DC

## 2020-08-09 NOTE — Progress Notes (Signed)
Chief Complaint  Patient presents with  . Annual Exam    Well Male Gary Barnett is here for a complete physical.   His last physical was >1 year ago.  Current diet: in general, a "healthy" diet.   Current exercise: lifting, walking Weight trend: stable Fatigue out of ordinary? No. Seat belt? Yes.    Health maintenance Shingrix- No Colonoscopy- Yes Tetanus- Yes Hep C- Yes Pneumonia vaccine- Due today AAA screening- Yes  Past Medical History:  Diagnosis Date  . Diabetes mellitus without complication ([place])   . Hyperlipidemia   . Hypertension      Past Surgical History:  Procedure Laterality Date  . CORONARY STENT PLACEMENT     Thinks left main, around age 40    Medications  Current Outpatient Medications on File Prior to Visit  Medication Sig Dispense Refill  . Accu-Chek Softclix Lancets lancets Use twice daily to check blood sugar 100 each 3  . Alcohol Swabs (B-D SINGLE USE SWABS REGULAR) PADS Use daily when checking blood sugar 100 each 3  . aspirin 325 MG tablet Take 325 mg by mouth.    Marland Kitchen atorvastatin (LIPITOR) 80 MG tablet Take 1 tablet (80 mg total) by mouth daily. 30 tablet 0  . citalopram (CELEXA) 40 MG tablet Take 1 tablet (40 mg total) by mouth daily. 90 tablet 2  . ELDERBERRY PO Take by mouth.    Marland Kitchen glipiZIDE (GLUCOTROL XL) 5 MG 24 hr tablet Take 2 tablets (10 mg total) by mouth daily with breakfast. 180 tablet 2  . glucose blood (ACCU-CHEK AVIVA PLUS) test strip Test twice daily for blood sugar 100 each 3  . levocetirizine (XYZAL) 5 MG tablet Take 1 tablet (5 mg total) by mouth every evening. 90 tablet 2  . lisinopril (ZESTRIL) 5 MG tablet Take 1 tablet (5 mg total) by mouth daily. 90 tablet 2  . metFORMIN (GLUCOPHAGE) 1000 MG tablet Take 1 tablet (1,000 mg total) by mouth 2 (two) times daily with a meal. 180 tablet 2  . metoprolol succinate (TOPROL-XL) 50 MG 24 hr tablet Take 1 tablet (50 mg total) by mouth daily. Take with or immediately following a meal.  30 tablet 3  . nitroGLYCERIN (NITROSTAT) 0.4 MG SL tablet Place 1 tablet (0.4 mg total) under the tongue every 5 (five) minutes x 3 doses as needed for chest pain. 25 tablet 1  . pioglitazone (ACTOS) 30 MG tablet TAKE 1 TABLET(30 MG) BY MOUTH DAILY 30 tablet 1  . sertraline (ZOLOFT) 50 MG tablet Take 1 tablet (50 mg total) by mouth daily. 90 tablet 2  . vitamin C (ASCORBIC ACID) 500 MG tablet Take 500 mg by mouth 2 (two) times daily.    . zaleplon (SONATA) 5 MG capsule Take 1 capsule (5 mg total) by mouth at bedtime as needed for sleep. 30 capsule 1   Allergies Allergies  Allergen Reactions  . Clopidogrel Hives and Itching    Family History Family History  Problem Relation Age of Onset  . Heart attack Brother   . Heart disease Brother   . Hyperlipidemia Mother   . Hypertension Father     Review of Systems: Constitutional:  no fevers Eye:  no recent significant change in vision Ears:  No changes in hearing Nose/Mouth/Throat:  no complaints of nasal congestion, no sore throat Cardiovascular: no chest pain Respiratory:  No shortness of breath Gastrointestinal:  No change in bowel habits GU:  No frequency Integumentary:  no abnormal skin lesions reported Neurologic:  no headaches Endocrine:  denies unexplained weight changes  Exam BP 110/64 (BP Location: Left Arm, Patient Position: Sitting, Cuff Size: Normal)   Pulse (!) 55   Temp 97.7 F (36.5 C) (Oral)   Ht 6' (1.829 m)   Wt 202 lb 2 oz (91.7 kg)   SpO2 100%   BMI 27.41 kg/m  General:  well developed, well nourished, in no apparent distress Skin:  no significant moles, warts, or growths Head:  no masses, lesions, or tenderness Eyes:  pupils equal and round, sclera anicteric without injection Ears:  canals without lesions, TMs shiny without retraction, no obvious effusion, no erythema Nose:  nares patent, septum midline, mucosa normal Throat/Pharynx:  lips and gingiva without lesion; tongue and uvula midline;  non-inflamed pharynx; no exudates or postnasal drainage Lungs:  clear to auscultation, breath sounds equal bilaterally, no respiratory distress Cardio:  regular rate and rhythm, no LE edema or bruits Rectal: Deferred GI: BS+, S, NT, ND, no masses or organomegaly Musculoskeletal:  symmetrical muscle groups noted without atrophy or deformity Neuro:  gait normal; deep tendon reflexes normal and symmetric Psych: well oriented with normal range of affect and appropriate judgment/insight  Assessment and Plan  Well adult exam  Need for influenza vaccination - Plan: Flu Vaccine QUAD High Dose(Fluad)  Type 2 diabetes mellitus with hyperglycemia, without long-term current use of insulin (HCC) - Plan: Lipid panel, Hemoglobin A1c, Comprehensive metabolic panel, CBC, Microalbumin / creatinine urine ratio, metFORMIN (GLUCOPHAGE) 1000 MG tablet, glipiZIDE (GLUCOTROL XL) 5 MG 24 hr tablet, atorvastatin (LIPITOR) 80 MG tablet  Screening for prostate cancer - Plan: PSA  Need for vaccination against Streptococcus pneumoniae - Plan: Pneumococcal polysaccharide vaccine 23-valent greater than or equal to 2yo subcutaneous/IM  GAD (generalized anxiety disorder) - Plan: citalopram (CELEXA) 40 MG tablet   Well 69 y.o. male. Counseled on diet and exercise. Counseled on risks/benefits of prostate cancer screening, he follows w urology and requests this to be done so they can have results next week.  Other orders as above. Follow up in 3-6 mo pending above a1c.  The patient voiced understanding and agreement to the plan.  [place], DO 08/09/20 2:53 PM

## 2020-08-09 NOTE — Patient Instructions (Addendum)
Give [place] 2-3 business days to get the results of your labs back.   Have a good birthday.  Keep the diet clean and stay active.  Let us know if you need anything.  Semimembranosus Tendinitis Rehab  It is normal to feel mild stretching, pulling, tightness, or discomfort as you do these exercises, but you should stop right away if you feel sudden pain or your pain gets worse.  Stretching and range of motion exercises These exercises warm up your muscles and joints and improve the movement and flexibility of your thigh. These exercises also help to relieve pain, numbness, and tingling. Exercise A: Hamstring stretch, supine    1. Lie on your back. Loop a belt or towel across the ball of your left / right foot The ball of your foot is on the walking surface, right under your toes. 2. Straighten your left / right knee and slowly pull on the belt to raise your leg. Stop when you feel a gentle stretch behind your left / right knee or thigh. ? Do not allow the knee to bend. ? Keep your other leg flat on the floor. 3. Hold this position for 30 seconds. Repeat 2 times. Complete this exercise 3 times a week. Strengthening exercises These exercises build strength and endurance in your thigh. Endurance is the ability to use your muscles for a long time, even after they get tired. Exercise B: Straight leg raises (hip extensors) 1. Lie on your belly on a bed or a firm surface with a pillow under your hips. 2. Bend your left / right knee so your foot is straight up in the air. 3. Squeeze your buttock muscles and lift your left / right thigh off the bed. Do not let your back arch. 4. Hold this position for 3 seconds. 5. Slowly return to the starting position. Let your muscles relax completely before you do another repetition. Repeat 2 times. Complete this exercise 3 times a week. Exercise C: Bridge (hip extensors)     1. Lie on your back on a firm surface with your knees bent and your feet flat on the  floor. 2. Tighten your buttocks muscles and lift your bottom off the floor until your trunk is level with your thighs. ? You should feel the muscles working in your buttocks and the back of your thighs. If you do not feel these muscles, slide your feet 1-2 inches (2.5-5 cm) farther away from your buttocks. ? Do not arch your back. 3. Hold this position for 3 seconds. 4. Slowly lower your hips to the starting position. 5. Let your buttocks muscles relax completely between repetitions. If this exercise is too easy, try doing it with your arms crossed over your chest. Repeat 2 times. Complete this exercise 3 times a week. Exercise D: Hamstring eccentric, prone 1. Lie on your belly on a bed or on the floor. 2. Start with your legs straight. Cross your legs at the ankles with your left / right leg on top. 3. Using your bottom leg to do the work, bend both knees. 4. Using just your left / right leg alone, slowly lower your leg back down toward the bed. Add a 5 lb weight as told by your health care provider. 5. Let your muscles relax completely between repetitions. Repeat 2 times. Complete this exercise 3 times a week. Exercise E: Squats 1. Stand in front of a table, with your feet and knees pointing straight ahead. You may rest your hands on  the table for balance but not for support. 2. Slowly bend your knees and lower your hips like you are going to sit in a chair. Keep your thighs straight or pointed slightly outward. ? Keep your weight over your heels, not over your toes. ? Keep your lower legs upright so they are parallel with the table legs. ? Do not let your hips go lower than your knees. Stop when your knees are bent to the shape of an upside-down letter "L" (90 degree angle). ? Do not bend lower than told by your health care provider. ? If your knee pain increases, do not bend as low. 3. Hold the squat position 1-2 seconds. 4. Slowly push with your legs to return to standing. Do not use  your hands to pull yourself to standing. Repeat 2 times. Complete this exercise 3 times a week. Make sure you discuss any questions you have with your health care provider. Document Released: 10/22/2005 Document Revised: 06/28/2016 Document Reviewed: 07/26/2015 Elsevier Interactive Patient Education  [employer].

## 2020-08-10 LAB — COMPREHENSIVE METABOLIC PANEL
AG Ratio: 2.3 (calc) (ref 1.0–2.5)
ALT: 22 U/L (ref 9–46)
AST: 15 U/L (ref 10–35)
Albumin: 4.5 g/dL (ref 3.6–5.1)
Alkaline phosphatase (APISO): 96 U/L (ref 35–144)
BUN: 17 mg/dL (ref 7–25)
CO2: 29 mmol/L (ref 20–32)
Calcium: 9.3 mg/dL (ref 8.6–10.3)
Chloride: 103 mmol/L (ref 98–110)
Creat: 0.86 mg/dL (ref 0.70–1.25)
Globulin: 2 g/dL (calc) (ref 1.9–3.7)
Glucose, Bld: 86 mg/dL (ref 65–99)
Potassium: 4.5 mmol/L (ref 3.5–5.3)
Sodium: 140 mmol/L (ref 135–146)
Total Bilirubin: 1.1 mg/dL (ref 0.2–1.2)
Total Protein: 6.5 g/dL (ref 6.1–8.1)

## 2020-08-10 LAB — LIPID PANEL
Cholesterol: 94 mg/dL (ref <200)
HDL: 37 mg/dL — ABNORMAL LOW (ref >=40)
LDL Cholesterol (Calc): 40 mg/dL (calc)
Non-HDL Cholesterol (Calc): 57 mg/dL (calc) (ref <130)
Total CHOL/HDL Ratio: 2.5 (calc) (ref <5.0)
Triglycerides: 86 mg/dL (ref <150)

## 2020-08-10 LAB — CBC
HCT: 39.2 % (ref 38.5–50.0)
Hemoglobin: 13.4 g/dL (ref 13.2–17.1)
MCH: 30.2 pg (ref 27.0–33.0)
MCHC: 34.2 g/dL (ref 32.0–36.0)
MCV: 88.5 fL (ref 80.0–100.0)
MPV: 9.9 fL (ref 7.5–12.5)
Platelets: 216 10*3/uL (ref 140–400)
RBC: 4.43 10*6/uL (ref 4.20–5.80)
RDW: 13.1 % (ref 11.0–15.0)
WBC: 6.6 10*3/uL (ref 3.8–10.8)

## 2020-08-10 LAB — HEMOGLOBIN A1C
Hgb A1c MFr Bld: 7.1 % of total Hgb — ABNORMAL HIGH (ref <5.7)
Mean Plasma Glucose: 157 (calc)
eAG (mmol/L): 8.7 (calc)

## 2020-08-10 LAB — PSA: PSA: 1.1 ng/mL (ref <=4.0)

## 2020-08-10 LAB — MICROALBUMIN / CREATININE URINE RATIO
Creatinine, Urine: 85 mg/dL (ref 20–320)
Microalb Creat Ratio: 6 mcg/mg creat (ref <30)
Microalb, Ur: 0.5 mg/dL

## 2020-08-15 ENCOUNTER — Telehealth: Payer: Self-pay | Admitting: Family Medicine

## 2020-08-15 NOTE — Telephone Encounter (Signed)
Caller: Etheridge Geil Call Back # [phone]  Patient's wife called to speak to you in regards to documentation needed for her husband...  Please advise

## 2020-08-15 NOTE — Telephone Encounter (Signed)
Called the patient back left message to call back.

## 2020-08-16 DIAGNOSIS — N320 Bladder-neck obstruction: Secondary | ICD-10-CM | POA: Diagnosis not present

## 2020-08-16 NOTE — Telephone Encounter (Signed)
Called left message to call back 

## 2020-08-17 NOTE — Telephone Encounter (Signed)
Call her back at [phone]

## 2020-08-17 NOTE — Telephone Encounter (Signed)
Spoke to his wife. She wanted to know her husbands most recent lipid panel. Information given

## 2020-09-16 ENCOUNTER — Other Ambulatory Visit: Payer: Self-pay | Admitting: Family Medicine

## 2020-10-05 ENCOUNTER — Other Ambulatory Visit: Payer: Self-pay | Admitting: Family Medicine

## 2020-10-05 ENCOUNTER — Telehealth: Payer: Self-pay | Admitting: Family Medicine

## 2020-10-05 MED ORDER — METOPROLOL SUCCINATE ER 50 MG PO TB24
50.0000 mg | ORAL_TABLET | Freq: Every day | ORAL | 3 refills | Status: DC
Start: 2020-10-05 — End: 2020-12-06

## 2020-10-05 NOTE — Telephone Encounter (Signed)
Patient is requesting a call back in reference to a medication problem.   Call Back # [phone]

## 2020-10-05 NOTE — Telephone Encounter (Signed)
The patient needed clarification that he is no longer on Carvediol/but taking metoprolol. PCP did clarify this with the pharmacy as well Also wanted a 90 day supply sent to [facility]/which has been taken care of already.

## 2020-10-07 ENCOUNTER — Other Ambulatory Visit: Payer: Self-pay | Admitting: Family Medicine

## 2020-10-07 DIAGNOSIS — E1165 Type 2 diabetes mellitus with hyperglycemia: Secondary | ICD-10-CM

## 2020-10-18 ENCOUNTER — Telehealth: Payer: Self-pay | Admitting: Family Medicine

## 2020-10-18 MED ORDER — BD SWAB SINGLE USE REGULAR PADS
MEDICATED_PAD | 3 refills | Status: DC
Start: 2020-10-18 — End: 2021-12-08

## 2020-10-18 MED ORDER — ACCU-CHEK AVIVA PLUS VI STRP
ORAL_STRIP | 3 refills | Status: DC
Start: 2020-10-18 — End: 2020-12-06

## 2020-10-18 MED ORDER — ACCU-CHEK SOFTCLIX LANCETS MISC
3 refills | Status: DC
Start: 2020-10-18 — End: 2020-10-25

## 2020-10-18 NOTE — Telephone Encounter (Signed)
Refills done.

## 2020-10-18 NOTE — Telephone Encounter (Signed)
Medication: Accu-Chek Softclix Lancets lancets [id number]  Alcohol Swabs (B-D SINGLE USE SWABS REGULAR) PADS [[id number]]   glucose blood (ACCU-CHEK AVIVA PLUS) test strip [[id number]]  Has the patient contacted their pharmacy? No. (If no, request that the patient contact the pharmacy for the refill.) (If yes, when and what did the pharmacy advise?)  Preferred Pharmacy (with phone number or street name):  [facility], [address] [place], [address] Phone:  [phone]  Fax:  [phone]       Agent: Please be advised that RX refills may take up to 3 business days. We ask that you follow-up with your pharmacy.

## 2020-10-25 ENCOUNTER — Telehealth: Payer: Self-pay | Admitting: Family Medicine

## 2020-10-25 MED ORDER — ACCU-CHEK FASTCLIX LANCET KIT
PACK | 0 refills | Status: DC
Start: 2020-10-25 — End: 2020-12-19

## 2020-10-25 MED ORDER — ACCU-CHEK FASTCLIX LANCETS MISC
3 refills | Status: DC
Start: 2020-10-25 — End: 2020-12-06

## 2020-10-25 NOTE — Telephone Encounter (Signed)
Patient wife is requesting a call back in reference to diabetic supplies.

## 2020-10-25 NOTE — Telephone Encounter (Signed)
Spoke with the patients wife and sent in lancets/and kit.

## 2020-10-25 NOTE — Telephone Encounter (Signed)
Called back [phone] left msg to call back

## 2020-11-30 ENCOUNTER — Other Ambulatory Visit: Payer: Self-pay | Admitting: Family Medicine

## 2020-11-30 DIAGNOSIS — F411 Generalized anxiety disorder: Secondary | ICD-10-CM

## 2020-11-30 MED ORDER — CITALOPRAM HYDROBROMIDE 40 MG PO TABS: 40.0000 mg | ORAL_TABLET | Freq: Every day | ORAL | 2 refills | Status: DC

## 2020-12-06 ENCOUNTER — Other Ambulatory Visit: Payer: Self-pay | Admitting: Family Medicine

## 2020-12-06 DIAGNOSIS — E1165 Type 2 diabetes mellitus with hyperglycemia: Secondary | ICD-10-CM

## 2020-12-06 MED ORDER — METFORMIN HCL 1000 MG PO TABS: 1000.0000 mg | ORAL_TABLET | Freq: Two times a day (BID) | ORAL | 2 refills | Status: DC

## 2020-12-06 MED ORDER — METOPROLOL SUCCINATE ER 50 MG PO TB24: 50.0000 mg | ORAL_TABLET | Freq: Every day | ORAL | 3 refills | Status: DC

## 2020-12-06 MED ORDER — LEVOCETIRIZINE DIHYDROCHLORIDE 5 MG PO TABS: 5.0000 mg | ORAL_TABLET | Freq: Every evening | ORAL | 2 refills | Status: DC

## 2020-12-06 MED ORDER — ACCU-CHEK AVIVA PLUS VI STRP: ORAL_STRIP | 3 refills | Status: DC

## 2020-12-06 MED ORDER — ACCU-CHEK FASTCLIX LANCETS MISC: 3 refills | Status: DC

## 2020-12-06 MED ORDER — PIOGLITAZONE HCL 30 MG PO TABS
30.0000 mg | ORAL_TABLET | Freq: Every day | ORAL | 2 refills | Status: DC
Start: 2020-12-06 — End: 2021-03-08

## 2020-12-06 MED ORDER — GLIPIZIDE ER 5 MG PO TB24: ORAL_TABLET | ORAL | 3 refills | Status: DC

## 2020-12-06 MED ORDER — ATORVASTATIN CALCIUM 80 MG PO TABS: 80.0000 mg | ORAL_TABLET | Freq: Every day | ORAL | 2 refills | Status: DC

## 2020-12-19 ENCOUNTER — Other Ambulatory Visit: Payer: Self-pay | Admitting: Family Medicine

## 2020-12-19 MED ORDER — ONETOUCH DELICA LANCETS 33G MISC: 3 refills | Status: DC

## 2020-12-19 MED ORDER — ONETOUCH VERIO VI STRP: ORAL_STRIP | 3 refills | Status: DC

## 2020-12-19 MED ORDER — ONETOUCH VERIO W/DEVICE KIT: PACK | 0 refills | Status: DC

## 2021-01-18 DIAGNOSIS — D225 Melanocytic nevi of trunk: Secondary | ICD-10-CM | POA: Diagnosis not present

## 2021-01-18 DIAGNOSIS — L821 Other seborrheic keratosis: Secondary | ICD-10-CM | POA: Diagnosis not present

## 2021-01-18 DIAGNOSIS — D2372 Other benign neoplasm of skin of left lower limb, including hip: Secondary | ICD-10-CM | POA: Diagnosis not present

## 2021-02-07 ENCOUNTER — Ambulatory Visit: Payer: Medicare Other | Admitting: Family Medicine

## 2021-02-15 ENCOUNTER — Telehealth: Payer: Self-pay | Admitting: Family Medicine

## 2021-02-15 MED ORDER — LEVOCETIRIZINE DIHYDROCHLORIDE 5 MG PO TABS: 5.0000 mg | ORAL_TABLET | Freq: Every evening | ORAL | 3 refills | Status: DC

## 2021-02-15 NOTE — Telephone Encounter (Signed)
Patient is out / please send to local pharmacy  Medication: levocetirizine (XYZAL) 5 MG tablet [[id number]]       Has the patient contacted their pharmacy?  (If no, request that the patient contact the pharmacy for the refill.) (If yes, when and what did the pharmacy advise?)     Preferred Pharmacy (with phone number or street name):  [facility], [place] [address] Phone:  [phone]  Fax:  [phone]          Agent: Please be advised that RX refills may take up to 3 business days. We ask that you follow-up with your pharmacy.

## 2021-02-15 NOTE — Telephone Encounter (Signed)
Refill done.  

## 2021-03-08 ENCOUNTER — Encounter: Payer: Self-pay | Admitting: Family Medicine

## 2021-03-08 ENCOUNTER — Other Ambulatory Visit: Payer: Self-pay | Admitting: Family Medicine

## 2021-03-08 ENCOUNTER — Other Ambulatory Visit: Payer: Self-pay

## 2021-03-08 ENCOUNTER — Ambulatory Visit (INDEPENDENT_AMBULATORY_CARE_PROVIDER_SITE_OTHER): Payer: Medicare Other | Admitting: Family Medicine

## 2021-03-08 VITALS — BP 120/70 | HR 56 | Temp 97.6°F | Ht 72.0 in | Wt 202.5 lb

## 2021-03-08 DIAGNOSIS — E1165 Type 2 diabetes mellitus with hyperglycemia: Secondary | ICD-10-CM | POA: Diagnosis not present

## 2021-03-08 DIAGNOSIS — F411 Generalized anxiety disorder: Secondary | ICD-10-CM

## 2021-03-08 DIAGNOSIS — F325 Major depressive disorder, single episode, in full remission: Secondary | ICD-10-CM | POA: Diagnosis not present

## 2021-03-08 DIAGNOSIS — I10 Essential (primary) hypertension: Secondary | ICD-10-CM

## 2021-03-08 DIAGNOSIS — R748 Abnormal levels of other serum enzymes: Secondary | ICD-10-CM

## 2021-03-08 LAB — COMPREHENSIVE METABOLIC PANEL
ALT: 23 U/L (ref 0–53)
AST: 14 U/L (ref 0–37)
Albumin: 4.5 g/dL (ref 3.5–5.2)
Alkaline Phosphatase: 124 U/L — ABNORMAL HIGH (ref 39–117)
BUN: 16 mg/dL (ref 6–23)
CO2: 30 mEq/L (ref 19–32)
Calcium: 9.5 mg/dL (ref 8.4–10.5)
Chloride: 101 mEq/L (ref 96–112)
Creatinine, Ser: 0.81 mg/dL (ref 0.40–1.50)
GFR: 91.2 mL/min (ref >60.00)
Glucose, Bld: 141 mg/dL — ABNORMAL HIGH (ref 70–99)
Potassium: 4.6 mEq/L (ref 3.5–5.1)
Sodium: 139 mEq/L (ref 135–145)
Total Bilirubin: 1.1 mg/dL (ref 0.2–1.2)
Total Protein: 6.7 g/dL (ref 6.0–8.3)

## 2021-03-08 LAB — HEMOGLOBIN A1C: Hgb A1c MFr Bld: 7.9 % — ABNORMAL HIGH (ref 4.6–6.5)

## 2021-03-08 LAB — LIPID PANEL
Cholesterol: 107 mg/dL (ref 0–200)
HDL: 31.9 mg/dL — ABNORMAL LOW (ref >39.00)
LDL Cholesterol: 46 mg/dL (ref 0–99)
NonHDL: 74.77
Total CHOL/HDL Ratio: 3
Triglycerides: 146 mg/dL (ref 0.0–149.0)
VLDL: 29.2 mg/dL (ref 0.0–40.0)

## 2021-03-08 MED ORDER — NITROGLYCERIN 0.4 MG SL SUBL: 0.4000 mg | SUBLINGUAL_TABLET | SUBLINGUAL | 1 refills | Status: DC | PRN

## 2021-03-08 MED ORDER — METOPROLOL SUCCINATE ER 50 MG PO TB24: 50.0000 mg | ORAL_TABLET | Freq: Every day | ORAL | 3 refills | Status: DC

## 2021-03-08 MED ORDER — PIOGLITAZONE HCL 30 MG PO TABS: 30.0000 mg | ORAL_TABLET | Freq: Every day | ORAL | 2 refills | Status: DC

## 2021-03-08 MED ORDER — ATORVASTATIN CALCIUM 80 MG PO TABS: 80.0000 mg | ORAL_TABLET | Freq: Every day | ORAL | 2 refills | Status: DC

## 2021-03-08 MED ORDER — METFORMIN HCL 1000 MG PO TABS: 1000.0000 mg | ORAL_TABLET | Freq: Two times a day (BID) | ORAL | 2 refills | Status: DC

## 2021-03-08 MED ORDER — LISINOPRIL 5 MG PO TABS: 5.0000 mg | ORAL_TABLET | Freq: Every day | ORAL | 2 refills | Status: DC

## 2021-03-08 MED ORDER — GLIPIZIDE ER 5 MG PO TB24: ORAL_TABLET | ORAL | 3 refills | Status: DC

## 2021-03-08 MED ORDER — ACCU-CHEK AVIVA PLUS VI STRP: ORAL_STRIP | 3 refills | Status: DC

## 2021-03-08 MED ORDER — ACCU-CHEK SOFTCLIX LANCETS MISC: 3 refills | Status: DC

## 2021-03-08 MED ORDER — CITALOPRAM HYDROBROMIDE 40 MG PO TABS: 40.0000 mg | ORAL_TABLET | Freq: Every day | ORAL | 2 refills | Status: DC

## 2021-03-08 NOTE — Patient Instructions (Signed)
Give us 2-3 business days to get the results of your labs back.   Keep the diet clean and stay active.  Let us know if you need anything. 

## 2021-03-08 NOTE — Progress Notes (Signed)
Subjective:   Chief Complaint  Patient presents with  . Follow-up    Gary Barnett is a 70 y.o. male here for follow-up of diabetes.  Here w spouse.  [place]'s self monitored glucose range is 90-120's.  Patient denies hypoglycemic reactions. He checks his glucose levels 1 time(s) per day. Patient does not require insulin.   Medications include: Glipizide XL 10 mg/d, metformin 1000 mg bid, Actos 30 mg/d Diet is healthy overall.  Exercise: walking, wt resistance  No CP or SOB.  Hypertension Patient presents for hypertension follow up. He does not monitor home blood pressures. He is compliant with medications- lisinopril 5 mg/d, Toprol XL 50 mg/d. Patient has these side effects of medication: none Diet/exercise as above  GAD/Dep Taking Celexa 40 m/d. Reports doing well, no AE's. Compliant. No HI or SI. Not following w counselor or psychologist.   Past Medical History:  Diagnosis Date  . Diabetes mellitus without complication ([place])   . Hyperlipidemia   . Hypertension      Related testing: Retinal exam: Due in Aug Pneumovax: done  Objective:  BP 120/70 (BP Location: Left Arm, Patient Position: Sitting, Cuff Size: Normal)   Pulse (!) 56   Temp 97.6 F (36.4 C) (Oral)   Ht 6' (1.829 m)   Wt 202 lb 8 oz (91.9 kg)   SpO2 98%   BMI 27.46 kg/m  General:  Well developed, well nourished, in no apparent distress Skin:  Warm, no pallor or diaphoresis Head:  Normocephalic, atraumatic Eyes:  Pupils equal and round, sclera anicteric without injection  Lungs:  CTAB, no access msc use Cardio:  RRR, no bruits, no LE edema Psych: Age appropriate judgment and insight  Assessment:   Type 2 diabetes mellitus with hyperglycemia, without long-term current use of insulin (HCC) - Plan: Comprehensive metabolic panel, Lipid panel, Hemoglobin A1c, metFORMIN (GLUCOPHAGE) 1000 MG tablet, glipiZIDE (GLUCOTROL XL) 5 MG 24 hr tablet, atorvastatin (LIPITOR) 80 MG tablet  Essential  hypertension  GAD (generalized anxiety disorder) - Plan: citalopram (CELEXA) 40 MG tablet  Major depressive disorder with single episode, in full remission ([place]), Chronic   Plan:   1. Cont Glipizide XL 10 mg/d, metformin 1000 mg bid, Actos 30 mg/d. Counseled on diet and exercise. 2. Cont Toprol XL 50 mg/d, lisinopril 5 mg/d.  3/4. Cont Celexa 40 mg/d.  F/u in 6 mo for CPE. The patient voiced understanding and agreement to the plan.  [place], DO 03/08/21 11:32 AM

## 2021-03-21 ENCOUNTER — Other Ambulatory Visit: Payer: Self-pay

## 2021-03-21 MED ORDER — ONETOUCH DELICA LANCETS 33G MISC: 3 refills | Status: DC

## 2021-03-21 MED ORDER — ONETOUCH VERIO VI STRP: ORAL_STRIP | 3 refills | Status: DC

## 2021-03-21 MED ORDER — ONETOUCH VERIO W/DEVICE KIT: PACK | 0 refills | Status: DC

## 2021-03-27 ENCOUNTER — Other Ambulatory Visit: Payer: Self-pay

## 2021-03-27 ENCOUNTER — Other Ambulatory Visit: Payer: Medicare Other

## 2021-03-27 ENCOUNTER — Other Ambulatory Visit (INDEPENDENT_AMBULATORY_CARE_PROVIDER_SITE_OTHER): Payer: Medicare Other

## 2021-03-27 DIAGNOSIS — R748 Abnormal levels of other serum enzymes: Secondary | ICD-10-CM

## 2021-03-27 LAB — HEPATIC FUNCTION PANEL
ALT: 19 U/L (ref 0–53)
AST: 12 U/L (ref 0–37)
Albumin: 4.2 g/dL (ref 3.5–5.2)
Alkaline Phosphatase: 116 U/L (ref 39–117)
Bilirubin, Direct: 0.2 mg/dL (ref 0.0–0.3)
Total Bilirubin: 1.3 mg/dL — ABNORMAL HIGH (ref 0.2–1.2)
Total Protein: 6.4 g/dL (ref 6.0–8.3)

## 2021-06-14 DIAGNOSIS — K219 Gastro-esophageal reflux disease without esophagitis: Secondary | ICD-10-CM | POA: Diagnosis not present

## 2021-06-14 DIAGNOSIS — Z86010 Personal history of colonic polyps: Secondary | ICD-10-CM | POA: Diagnosis not present

## 2021-06-14 DIAGNOSIS — K5730 Diverticulosis of large intestine without perforation or abscess without bleeding: Secondary | ICD-10-CM | POA: Diagnosis not present

## 2021-06-20 ENCOUNTER — Other Ambulatory Visit: Payer: Self-pay | Admitting: Family Medicine

## 2021-06-22 ENCOUNTER — Telehealth: Payer: Self-pay | Admitting: Family Medicine

## 2021-06-22 NOTE — Telephone Encounter (Signed)
Left message for patient to call back and schedule Medicare Annual Wellness Visit (AWV) in office.   If not able to come in office, please offer to do virtually or by telephone.  Left office number and my jabber #[phone].  Due for AWVI  Please schedule at anytime with Nurse Health Advisor.   

## 2021-06-27 ENCOUNTER — Telehealth: Payer: Self-pay | Admitting: Family Medicine

## 2021-06-27 MED ORDER — ONETOUCH DELICA LANCETS 33G MISC: 3 refills | Status: DC

## 2021-06-27 NOTE — Telephone Encounter (Signed)
Sent prescription in.

## 2021-06-27 NOTE — Telephone Encounter (Signed)
Medication: OneTouch Delica Lancets [id number] MISC   Has the patient contacted their pharmacy? Yes.   (If no, request that the patient contact the pharmacy for the refill.) (If yes, when and what did the pharmacy advise?) Pharmacy called in to request  Preferred Pharmacy (with phone number or street name):  [facility] [address], [place], [place] [address]  Phone:  [phone]    Agent: Please be advised that RX refills may take up to 3 business days. We ask that you follow-up with your pharmacy.

## 2021-07-03 ENCOUNTER — Telehealth: Payer: Self-pay | Admitting: Family Medicine

## 2021-07-03 NOTE — Telephone Encounter (Signed)
Sched appt plz, I do want him to continue levocetirizine as a foundation until he is seen. Ty.

## 2021-07-03 NOTE — Telephone Encounter (Signed)
The patients wife stated he has tried Claritin, levocetirizine and loratidine. The wife states he keeps a runny  nose.  He has not tried any nasal sprays. The patient would like to have something sent in that he has not tried.

## 2021-07-03 NOTE — Telephone Encounter (Signed)
Pt. Wife Gary Barnett called in stating that he has tried several different types of allergy medications but none are working for him. She was wanting to discuss other options. She can be contacted at: [phone]

## 2021-07-03 NOTE — Telephone Encounter (Signed)
Called and scheduled Video visit for 07/04/21

## 2021-07-04 ENCOUNTER — Other Ambulatory Visit: Payer: Self-pay

## 2021-07-04 ENCOUNTER — Encounter: Payer: Self-pay | Admitting: Family Medicine

## 2021-07-04 ENCOUNTER — Telehealth (INDEPENDENT_AMBULATORY_CARE_PROVIDER_SITE_OTHER): Payer: Medicare Other | Admitting: Family Medicine

## 2021-07-04 DIAGNOSIS — J302 Other seasonal allergic rhinitis: Secondary | ICD-10-CM | POA: Diagnosis not present

## 2021-07-04 MED ORDER — FLUTICASONE PROPIONATE 50 MCG/ACT NA SUSP: 2.0000 | Freq: Every day | NASAL | 2 refills | Status: DC

## 2021-07-04 NOTE — Progress Notes (Signed)
Chief Complaint  Patient presents with   Allergies    Medication problem- current allergy not working well.     Subjective: Patient is a 70 y.o. male here for allergies. Due to COVID-19 pandemic, we are interacting via web portal for an electronic face-to-face visit. I verified patient's ID using 2 identifiers. Patient agreed to proceed with visit via this method. Patient is at home, I am at office. Patient, spouse and I are present for visit.   Runny nose that is worse in the morning and also in afternoon though not as severe. Failed Zyrtec, Benadryl, Claritin, and currently on Xyzal which has not been helpful. Does get o AE's, reports compliance. He has never been on a nasal spray. Denies fevers, cough, sob, wheezing, myalgias, n/v/d, ear pain/drainage, nasal congestion, ST.Marland Kitchen  Past Medical History:  Diagnosis Date   Diabetes mellitus without complication ([place])    Hyperlipidemia    Hypertension     Objective: No conversational dyspnea Age appropriate judgment and insight Nml affect and mood  Assessment and Plan: Seasonal allergies - Plan: fluticasone (FLONASE) 50 MCG/ACT nasal spray  Cont Xyzal. He will send message after 4 week INCS trial. Will add Singulair.  Total time: 13 min The patient and his spouse voiced understanding and agreement to the plan.  [place], DO 07/04/21  11:45 AM

## 2021-07-11 DIAGNOSIS — E119 Type 2 diabetes mellitus without complications: Secondary | ICD-10-CM | POA: Diagnosis not present

## 2021-07-19 DIAGNOSIS — K219 Gastro-esophageal reflux disease without esophagitis: Secondary | ICD-10-CM | POA: Diagnosis not present

## 2021-07-19 DIAGNOSIS — K209 Esophagitis, unspecified without bleeding: Secondary | ICD-10-CM | POA: Diagnosis not present

## 2021-07-19 DIAGNOSIS — Z87891 Personal history of nicotine dependence: Secondary | ICD-10-CM | POA: Diagnosis not present

## 2021-07-19 DIAGNOSIS — Z951 Presence of aortocoronary bypass graft: Secondary | ICD-10-CM | POA: Diagnosis not present

## 2021-07-19 DIAGNOSIS — K5730 Diverticulosis of large intestine without perforation or abscess without bleeding: Secondary | ICD-10-CM | POA: Diagnosis not present

## 2021-07-19 DIAGNOSIS — K635 Polyp of colon: Secondary | ICD-10-CM | POA: Diagnosis not present

## 2021-07-19 DIAGNOSIS — D123 Benign neoplasm of transverse colon: Secondary | ICD-10-CM | POA: Diagnosis not present

## 2021-07-19 DIAGNOSIS — I119 Hypertensive heart disease without heart failure: Secondary | ICD-10-CM | POA: Diagnosis not present

## 2021-07-19 DIAGNOSIS — K2950 Unspecified chronic gastritis without bleeding: Secondary | ICD-10-CM | POA: Diagnosis not present

## 2021-07-19 DIAGNOSIS — Z86010 Personal history of colonic polyps: Secondary | ICD-10-CM | POA: Diagnosis not present

## 2021-07-19 DIAGNOSIS — Z955 Presence of coronary angioplasty implant and graft: Secondary | ICD-10-CM | POA: Diagnosis not present

## 2021-07-19 DIAGNOSIS — E119 Type 2 diabetes mellitus without complications: Secondary | ICD-10-CM | POA: Diagnosis not present

## 2021-07-19 DIAGNOSIS — F419 Anxiety disorder, unspecified: Secondary | ICD-10-CM | POA: Diagnosis not present

## 2021-07-19 DIAGNOSIS — K2100 Gastro-esophageal reflux disease with esophagitis, without bleeding: Secondary | ICD-10-CM | POA: Diagnosis not present

## 2021-07-19 DIAGNOSIS — Z888 Allergy status to other drugs, medicaments and biological substances status: Secondary | ICD-10-CM | POA: Diagnosis not present

## 2021-07-19 DIAGNOSIS — F32A Depression, unspecified: Secondary | ICD-10-CM | POA: Diagnosis not present

## 2021-07-19 DIAGNOSIS — R1310 Dysphagia, unspecified: Secondary | ICD-10-CM | POA: Diagnosis not present

## 2021-07-19 DIAGNOSIS — I252 Old myocardial infarction: Secondary | ICD-10-CM | POA: Diagnosis not present

## 2021-07-19 DIAGNOSIS — K621 Rectal polyp: Secondary | ICD-10-CM | POA: Diagnosis not present

## 2021-07-19 DIAGNOSIS — I2510 Atherosclerotic heart disease of native coronary artery without angina pectoris: Secondary | ICD-10-CM | POA: Diagnosis not present

## 2021-07-19 DIAGNOSIS — Z1211 Encounter for screening for malignant neoplasm of colon: Secondary | ICD-10-CM | POA: Diagnosis not present

## 2021-07-27 DIAGNOSIS — K449 Diaphragmatic hernia without obstruction or gangrene: Secondary | ICD-10-CM | POA: Diagnosis not present

## 2021-07-27 DIAGNOSIS — K219 Gastro-esophageal reflux disease without esophagitis: Secondary | ICD-10-CM | POA: Diagnosis not present

## 2021-07-27 DIAGNOSIS — Z9889 Other specified postprocedural states: Secondary | ICD-10-CM | POA: Diagnosis not present

## 2021-07-29 DIAGNOSIS — J3489 Other specified disorders of nose and nasal sinuses: Secondary | ICD-10-CM | POA: Diagnosis not present

## 2021-07-29 DIAGNOSIS — R0981 Nasal congestion: Secondary | ICD-10-CM | POA: Diagnosis not present

## 2021-07-29 DIAGNOSIS — R059 Cough, unspecified: Secondary | ICD-10-CM | POA: Diagnosis not present

## 2021-07-29 DIAGNOSIS — U071 COVID-19: Secondary | ICD-10-CM | POA: Diagnosis not present

## 2021-07-31 ENCOUNTER — Telehealth: Payer: Self-pay | Admitting: Family Medicine

## 2021-07-31 NOTE — Telephone Encounter (Signed)
Pt. Wife asked for a call regarding husband having covid and medication that was sent in needs to be changed. She would like to discuss this further   [place]

## 2021-07-31 NOTE — Telephone Encounter (Signed)
Called the wife and informed they would need to call back where the patient was seen at. [facility] in [place] She agreed to do and will discuss all with PCP at the video visit on 08/03/21

## 2021-08-03 ENCOUNTER — Other Ambulatory Visit: Payer: Self-pay

## 2021-08-03 ENCOUNTER — Telehealth (INDEPENDENT_AMBULATORY_CARE_PROVIDER_SITE_OTHER): Payer: Medicare Other | Admitting: Family Medicine

## 2021-08-03 ENCOUNTER — Encounter: Payer: Self-pay | Admitting: Family Medicine

## 2021-08-03 DIAGNOSIS — J302 Other seasonal allergic rhinitis: Secondary | ICD-10-CM

## 2021-08-03 DIAGNOSIS — U071 COVID-19: Secondary | ICD-10-CM | POA: Diagnosis not present

## 2021-08-03 HISTORY — DX: Other seasonal allergic rhinitis: J30.2

## 2021-08-03 MED ORDER — BENZONATATE 200 MG PO CAPS: 200.0000 mg | ORAL_CAPSULE | Freq: Two times a day (BID) | ORAL | 0 refills | Status: DC | PRN

## 2021-08-03 MED ORDER — MONTELUKAST SODIUM 10 MG PO TABS: 10.0000 mg | ORAL_TABLET | Freq: Every day | ORAL | 3 refills | Status: DC

## 2021-08-03 NOTE — Progress Notes (Signed)
Chief Complaint  Patient presents with   Allergies    [name] here for URI complaints. Due to COVID-19 pandemic, we are interacting via telephone. I verified patient's ID using 2 identifiers. Patient agreed to proceed with visit via this method. Patient is at home, I am at office. Patient, his spouse and I are present for visit.   Duration: 8 days  Associated symptoms: sinus congestion, rhinorrhea, and coughing, diarrhea (from Paxlovid) Denies: sinus pain, itchy watery eyes, ear pain, ear drainage, sore throat, wheezing, shortness of breath, myalgia, and fevers, N/V, loss of taste/smell Treatment to date: Paxlovid (couldn't finish 2/2 AE's), Tessalon Perles Sick contacts: No Tested + for covid at [facility] on 9/24.  Past Medical History:  Diagnosis Date   Diabetes mellitus without complication ([place])    Hyperlipidemia    Hypertension     Objective No conversational dyspnea Age appropriate judgment and insight Nml affect and mood  COVID-19 - Plan: benzonatate (TESSALON) 200 MG capsule  Seasonal allergies - Plan: montelukast (SINGULAIR) 10 MG tablet  Outside window for antiviral at this time.  Continue to push fluids, practice good hand hygiene, cover mouth when coughing. Discussed quarantining guidelines. F/u prn. If starting to experience fevers, shaking, or shortness of breath, seek immediate care. 2. Cont Xyzal and INCS. Add Singulair.  Total time: 12 min Pt and his wife voiced understanding and agreement to the plan.  [place], DO 08/03/21 10:43 AM

## 2021-08-07 ENCOUNTER — Telehealth: Payer: Self-pay | Admitting: Family Medicine

## 2021-08-07 NOTE — Telephone Encounter (Signed)
I called pt's wife back and she mentions on the phone that Gary Barnett is still sick as well with covid sx He tried paxlovid but it gave him GI side effects  At this time his chest is still congested but he is overall better  Suggested that he try mucinex I will try Mucinex I do not think to do any antiviral is going to be help as he has been sick for 10 days now

## 2021-08-08 ENCOUNTER — Telehealth: Payer: Medicare Other | Admitting: Internal Medicine

## 2021-08-08 ENCOUNTER — Other Ambulatory Visit: Payer: Self-pay

## 2021-08-25 ENCOUNTER — Telehealth (INDEPENDENT_AMBULATORY_CARE_PROVIDER_SITE_OTHER): Payer: Medicare Other | Admitting: Family Medicine

## 2021-08-25 ENCOUNTER — Other Ambulatory Visit: Payer: Self-pay

## 2021-08-25 ENCOUNTER — Encounter: Payer: Self-pay | Admitting: Family Medicine

## 2021-08-25 DIAGNOSIS — U071 COVID-19: Secondary | ICD-10-CM | POA: Diagnosis not present

## 2021-08-25 DIAGNOSIS — J302 Other seasonal allergic rhinitis: Secondary | ICD-10-CM | POA: Diagnosis not present

## 2021-08-25 MED ORDER — ASMANEX HFA 100 MCG/ACT IN AERO: INHALATION_SPRAY | RESPIRATORY_TRACT | 2 refills | Status: DC

## 2021-08-25 MED ORDER — BENZONATATE 200 MG PO CAPS: 200.0000 mg | ORAL_CAPSULE | Freq: Two times a day (BID) | ORAL | 0 refills | Status: DC | PRN

## 2021-08-25 NOTE — Progress Notes (Signed)
Chief Complaint  Patient presents with   Post COVID issues    Pt was Dx with COVID on 09/24. Pt states still having a productive cough.     Subjective: Patient is a 70 y.o. male here for post covid issues. Due to COVID-19 pandemic, we are interacting via telephone. Patient agreed to proceed with visit via this method. Patient is at home, I am at office. Patient, spouse and I are present for visit.   Dx w covid on 9/24. Tessalon Perles helped. Still having a lingering cough with clear mucus coming up. No wheezing, fevers, sob, ST, N/V/D, myalgias, CP, loss of taste/smell. +runny/stuffy nose.   Past Medical History:  Diagnosis Date   Diabetes mellitus without complication ([place])    Hyperlipidemia    Hypertension     Objective: No conversational dyspnea Age appropriate judgment and insight Nml affect and mood  Assessment and Plan: COVID-19 - Plan: Mometasone Furoate (ASMANEX HFA) 100 MCG/ACT AERO, benzonatate (TESSALON) 200 MG capsule  Rinse mouth out after use of ICS. Tessalon Perles prn. Push fluids. Mucinex prn.  Total time: 12 min The patient and spouse voiced understanding and agreement to the plan.  [place], DO 08/25/21  3:27 PM

## 2021-09-05 ENCOUNTER — Encounter: Payer: Medicare Other | Admitting: Family Medicine

## 2021-10-02 ENCOUNTER — Encounter: Payer: Self-pay | Admitting: Family Medicine

## 2021-10-02 ENCOUNTER — Other Ambulatory Visit: Payer: Self-pay

## 2021-10-02 ENCOUNTER — Ambulatory Visit (INDEPENDENT_AMBULATORY_CARE_PROVIDER_SITE_OTHER): Payer: Medicare Other | Admitting: Family Medicine

## 2021-10-02 VITALS — BP 118/78 | HR 57 | Temp 97.8°F | Ht 72.0 in | Wt 204.2 lb

## 2021-10-02 DIAGNOSIS — Z0000 Encounter for general adult medical examination without abnormal findings: Secondary | ICD-10-CM | POA: Diagnosis not present

## 2021-10-02 DIAGNOSIS — Z23 Encounter for immunization: Secondary | ICD-10-CM

## 2021-10-02 DIAGNOSIS — E1165 Type 2 diabetes mellitus with hyperglycemia: Secondary | ICD-10-CM | POA: Diagnosis not present

## 2021-10-02 DIAGNOSIS — Z125 Encounter for screening for malignant neoplasm of prostate: Secondary | ICD-10-CM | POA: Diagnosis not present

## 2021-10-02 MED ORDER — PANTOPRAZOLE SODIUM 20 MG PO TBEC: 20.0000 mg | DELAYED_RELEASE_TABLET | Freq: Every day | ORAL | 2 refills | Status: DC

## 2021-10-02 NOTE — Patient Instructions (Signed)
Give [place] 2-3 business days to get the results of your labs back.   Keep the diet clean and stay active.  I recommend getting the updated bivalent covid vaccination booster, 90 days after the last day of symptoms, at your convenience.   The new Shingrix vaccine (for shingles) is a 2 shot series. It can make people feel low energy, achy and almost like they have the flu for 48 hours after injection. Please plan accordingly when deciding on when to get this shot. Call our office for a nurse visit appointment to get this. The second shot of the series is less severe regarding the side effects, but it still lasts 48 hours.   Let us know if you need anything.

## 2021-10-02 NOTE — Progress Notes (Addendum)
Chief Complaint  Patient presents with   Follow-up    6 month    Well Male Gary Barnett is here for a complete physical.   His last physical was >1 year ago.  Current diet: in general, a "healthy" diet.   Current exercise: walking, lifting, elliptical  Weight trend: stable Fatigue out of ordinary? No. Seat belt? Yes.    Health maintenance Shingrix- No Colonoscopy- Yes Tetanus- Yes Hep C- Yes Lung cancer screening- No, N/A Pneumonia vaccine- UTD thus far thru advent of PCV20.  AAA screening- Yes  Past Medical History:  Diagnosis Date   Diabetes mellitus without complication ([place])    Hyperlipidemia    Hypertension      Past Surgical History:  Procedure Laterality Date   CORONARY STENT PLACEMENT     Thinks left main, around age 36    Medications  Current Outpatient Medications on File Prior to Visit  Medication Sig Dispense Refill   Alcohol Swabs (B-D SINGLE USE SWABS REGULAR) PADS Use daily when checking blood sugar 100 each 3   aspirin 325 MG tablet Take 325 mg by mouth.     atorvastatin (LIPITOR) 80 MG tablet Take 1 tablet (80 mg total) by mouth daily. 90 tablet 2   Blood Glucose Monitoring Suppl (ONETOUCH VERIO) w/Device KIT Use twice daily to check blood sugar.  DX E11.9 1 kit 0   citalopram (CELEXA) 40 MG tablet Take 1 tablet (40 mg total) by mouth daily. 90 tablet 2   ELDERBERRY PO Take by mouth.     fluticasone (FLONASE) 50 MCG/ACT nasal spray Place 2 sprays into both nostrils daily. 16 g 2   glipiZIDE (GLUCOTROL XL) 5 MG 24 hr tablet TAKE 2 TABLETS BY MOUTH  DAILY WITH BREAKFAST 180 tablet 3   glucose blood (ONETOUCH VERIO) test strip USE TO CHECK BLOOD SUGAR TWICE DAILY 200 strip 3   levocetirizine (XYZAL) 5 MG tablet Take 1 tablet (5 mg total) by mouth every evening. 90 tablet 3   lisinopril (ZESTRIL) 5 MG tablet Take 1 tablet (5 mg total) by mouth daily. 90 tablet 2   metFORMIN (GLUCOPHAGE) 1000 MG tablet Take 1 tablet (1,000 mg total) by mouth 2 (two)  times daily with a meal. 180 tablet 2   metoprolol succinate (TOPROL-XL) 50 MG 24 hr tablet Take 1 tablet (50 mg total) by mouth daily. Take with or immediately following a meal. 90 tablet 3   Mometasone Furoate (ASMANEX HFA) 100 MCG/ACT AERO Take 2 puffs twice daily. 13 g 2   montelukast (SINGULAIR) 10 MG tablet Take 1 tablet (10 mg total) by mouth at bedtime. 30 tablet 3   nitroGLYCERIN (NITROSTAT) 0.4 MG SL tablet Place 1 tablet (0.4 mg total) under the tongue every 5 (five) minutes x 3 doses as needed for chest pain. 25 tablet 1   OneTouch Delica Lancets 48G MISC Use twice daily to check blood sugar.  DXE11.9 100 each 3   pioglitazone (ACTOS) 30 MG tablet Take 1 tablet (30 mg total) by mouth daily. 90 tablet 2   vitamin C (ASCORBIC ACID) 500 MG tablet Take 500 mg by mouth 2 (two) times daily.     zaleplon (SONATA) 5 MG capsule Take 1 capsule (5 mg total) by mouth at bedtime as needed for sleep. 30 capsule 1   Allergies Allergies  Allergen Reactions   Clopidogrel Hives and Itching    Family History Family History  Problem Relation Age of Onset   Heart attack Brother  Heart disease Brother    Hyperlipidemia Mother    Hypertension Father     Review of Systems: Constitutional:  no fevers Eye:  no recent significant change in vision Ears:  No changes in hearing Nose/Mouth/Throat:  no complaints of nasal congestion, no sore throat Cardiovascular: no chest pain Respiratory:  No shortness of breath Gastrointestinal:  No change in bowel habits GU:  No frequency Integumentary:  no abnormal skin lesions reported Neurologic:  no headaches Endocrine:  denies unexplained weight changes  Exam BP 118/78   Pulse (!) 57   Temp 97.8 F (36.6 C) (Oral)   Ht 6' (1.829 m)   Wt 204 lb 4 oz (92.6 kg)   SpO2 99%   BMI 27.70 kg/m  General:  well developed, well nourished, in no apparent distress Skin: yellowing of 1st nail on L foot without ttp or warmth/fluctuance of surrounding tissue;  otherwise no significant moles, warts, or growths Head:  no masses, lesions, or tenderness Eyes:  pupils equal and round, sclera anicteric without injection Ears:  canals without lesions, TMs shiny without retraction, no obvious effusion, no erythema Nose:  nares patent, septum midline, mucosa normal Throat/Pharynx:  lips and gingiva without lesion; tongue and uvula midline; non-inflamed pharynx; no exudates or postnasal drainage Lungs:  clear to auscultation, breath sounds equal bilaterally, no respiratory distress Cardio:  regular rate and rhythm, no LE edema or bruits Rectal: Deferred GI: BS+, S, NT, ND, no masses or organomegaly Musculoskeletal:  symmetrical muscle groups noted without atrophy or deformity Neuro:  gait normal; deep tendon reflexes normal and symmetric Psych: well oriented with normal range of affect and appropriate judgment/insight  Assessment and Plan  Well adult exam  Type 2 diabetes mellitus with hyperglycemia, without long-term current use of insulin (HCC) - Plan: CBC, Comprehensive metabolic panel, Lipid panel, Hemoglobin A1c, Microalbumin / creatinine urine ratio  Screening for prostate cancer - Plan: PSA  Need for vaccination against Streptococcus pneumoniae - Plan: Pneumococcal conjugate vaccine 20-valent (Prevnar 70)   Well 70 y.o. male. Counseled on diet and exercise. Other orders as above. Shingrix and bivalent covid booster rec'd. PCV20 today.  Reassurance for the nail, he will let me know if anything changes.  Follow up in 6 mo.  The patient voiced understanding and agreement to the plan.  [place], DO 10/02/21 2:24 PM

## 2021-10-02 NOTE — Addendum Note (Signed)
Addended by: Sharon Seller B on: 10/02/2021 02:12 PM   Modules accepted: Orders

## 2021-10-03 ENCOUNTER — Other Ambulatory Visit: Payer: Self-pay | Admitting: Family Medicine

## 2021-10-03 DIAGNOSIS — R972 Elevated prostate specific antigen [PSA]: Secondary | ICD-10-CM

## 2021-10-03 LAB — COMPREHENSIVE METABOLIC PANEL
ALT: 22 U/L (ref 0–53)
AST: 14 U/L (ref 0–37)
Albumin: 4.4 g/dL (ref 3.5–5.2)
Alkaline Phosphatase: 93 U/L (ref 39–117)
BUN: 16 mg/dL (ref 6–23)
CO2: 27 mEq/L (ref 19–32)
Calcium: 9 mg/dL (ref 8.4–10.5)
Chloride: 103 mEq/L (ref 96–112)
Creatinine, Ser: 0.81 mg/dL (ref 0.40–1.50)
GFR: 90.83 mL/min (ref >60.00)
Glucose, Bld: 110 mg/dL — ABNORMAL HIGH (ref 70–99)
Potassium: 4.2 mEq/L (ref 3.5–5.1)
Sodium: 140 mEq/L (ref 135–145)
Total Bilirubin: 1.1 mg/dL (ref 0.2–1.2)
Total Protein: 6.6 g/dL (ref 6.0–8.3)

## 2021-10-03 LAB — LIPID PANEL
Cholesterol: 109 mg/dL (ref 0–200)
HDL: 34.3 mg/dL — ABNORMAL LOW (ref >39.00)
LDL Cholesterol: 51 mg/dL (ref 0–99)
NonHDL: 74.61
Total CHOL/HDL Ratio: 3
Triglycerides: 116 mg/dL (ref 0.0–149.0)
VLDL: 23.2 mg/dL (ref 0.0–40.0)

## 2021-10-03 LAB — HEMOGLOBIN A1C: Hgb A1c MFr Bld: 8.3 % — ABNORMAL HIGH (ref 4.6–6.5)

## 2021-10-03 LAB — MICROALBUMIN / CREATININE URINE RATIO
Creatinine,U: 113 mg/dL
Microalb Creat Ratio: 0.6 mg/g (ref 0.0–30.0)
Microalb, Ur: <0.7 mg/dL (ref 0.0–1.9)

## 2021-10-03 LAB — CBC
HCT: 42.1 % (ref 39.0–52.0)
Hemoglobin: 13.8 g/dL (ref 13.0–17.0)
MCHC: 32.6 g/dL (ref 30.0–36.0)
MCV: 91.6 fl (ref 78.0–100.0)
Platelets: 204 10*3/uL (ref 150.0–400.0)
RBC: 4.6 Mil/uL (ref 4.22–5.81)
RDW: 13.2 % (ref 11.5–15.5)
WBC: 6.6 10*3/uL (ref 4.0–10.5)

## 2021-10-03 LAB — PSA: PSA: 1.94 ng/mL (ref 0.10–4.00)

## 2021-10-09 ENCOUNTER — Other Ambulatory Visit: Payer: Self-pay | Admitting: Family Medicine

## 2021-10-09 DIAGNOSIS — F411 Generalized anxiety disorder: Secondary | ICD-10-CM

## 2021-10-09 DIAGNOSIS — E1165 Type 2 diabetes mellitus with hyperglycemia: Secondary | ICD-10-CM

## 2021-10-09 DIAGNOSIS — J302 Other seasonal allergic rhinitis: Secondary | ICD-10-CM

## 2021-10-09 MED ORDER — GLIPIZIDE ER 5 MG PO TB24: ORAL_TABLET | ORAL | 3 refills | Status: DC

## 2021-10-09 MED ORDER — PANTOPRAZOLE SODIUM 20 MG PO TBEC: 20.0000 mg | DELAYED_RELEASE_TABLET | Freq: Every day | ORAL | 2 refills | Status: DC

## 2021-10-09 MED ORDER — ATORVASTATIN CALCIUM 80 MG PO TABS: 80.0000 mg | ORAL_TABLET | Freq: Every day | ORAL | 2 refills | Status: DC

## 2021-10-09 MED ORDER — LEVOCETIRIZINE DIHYDROCHLORIDE 5 MG PO TABS: 5.0000 mg | ORAL_TABLET | Freq: Every evening | ORAL | 3 refills | Status: DC

## 2021-10-09 MED ORDER — METOPROLOL SUCCINATE ER 50 MG PO TB24: 50.0000 mg | ORAL_TABLET | Freq: Every day | ORAL | 3 refills | Status: DC

## 2021-10-09 MED ORDER — METFORMIN HCL 1000 MG PO TABS: 1000.0000 mg | ORAL_TABLET | Freq: Two times a day (BID) | ORAL | 2 refills | Status: DC

## 2021-10-09 MED ORDER — PIOGLITAZONE HCL 30 MG PO TABS: 30.0000 mg | ORAL_TABLET | Freq: Every day | ORAL | 2 refills | Status: DC

## 2021-10-09 MED ORDER — CITALOPRAM HYDROBROMIDE 40 MG PO TABS: 40.0000 mg | ORAL_TABLET | Freq: Every day | ORAL | 2 refills | Status: DC

## 2021-10-09 MED ORDER — NITROGLYCERIN 0.4 MG SL SUBL: 0.4000 mg | SUBLINGUAL_TABLET | SUBLINGUAL | 1 refills | Status: DC | PRN

## 2021-10-09 MED ORDER — MONTELUKAST SODIUM 10 MG PO TABS: 10.0000 mg | ORAL_TABLET | Freq: Every day | ORAL | 3 refills | Status: DC

## 2021-10-09 MED ORDER — LISINOPRIL 5 MG PO TABS: 5.0000 mg | ORAL_TABLET | Freq: Every day | ORAL | 2 refills | Status: DC

## 2021-10-11 NOTE — Progress Notes (Deleted)
NEW PATIENT Date of Service/Encounter:  10/11/21 Referring provider: Shelda Pal* Primary care provider: Shelda Pal, DO  Subjective:  Gary Barnett is a 70 y.o. male with a PMHx of CAD, type 2 diabetes, hypertension, hyperlipidemia, anxiety, benign prostatic hyperplasia vitamin D deficiency seasonal allergies presenting today for evaluation of "allergies". History obtained from: chart review and {Persons; PED relatives w/patient:19415::"patient"}.   Chronic rhinitis: started *** Symptoms include: {Blank multiple:19196:a:"***","nasal congestion","rhinorrhea","post nasal drainage","sneezing","watery eyes","itchy eyes","itchy nose"}  Occurs {Blank single:19197::"year-round","seasonally-***","year-round with seasonal flares","***"} Potential triggers: *** Treatments tried: Failed Zyrtec, Benadryl, Claritin, now on Xyzal and Flonase Previous allergy testing: {Blank single:19197::"yes","no"} History of reflux/heartburn: {Blank single:19197::"yes","no"}   Other allergy screening: Asthma: {Blank single:19197::"yes","no"} Rhino conjunctivitis: {Blank single:19197::"yes","no"} Food allergy: {Blank single:19197::"yes","no"} Medication allergy: {Blank single:19197::"yes","no"} Hymenoptera allergy: {Blank single:19197::"yes","no"} Urticaria: {Blank single:19197::"yes","no"} Eczema:{Blank single:19197::"yes","no"} History of recurrent infections suggestive of immunodeficency: {Blank single:19197::"yes","no"} ***Vaccinations are up to date.   Past Medical History: Past Medical History:  Diagnosis Date   Diabetes mellitus without complication ([place])    Hyperlipidemia    Hypertension    Medication List:  Current Outpatient Medications  Medication Sig Dispense Refill   Alcohol Swabs (B-D SINGLE USE SWABS REGULAR) PADS Use daily when checking blood sugar 100 each 3   aspirin 325 MG tablet Take 325 mg by mouth.     atorvastatin (LIPITOR) 80 MG tablet Take 1 tablet  (80 mg total) by mouth daily. 90 tablet 2   Blood Glucose Monitoring Suppl (ONETOUCH VERIO) w/Device KIT Use twice daily to check blood sugar.  DX E11.9 1 kit 0   citalopram (CELEXA) 40 MG tablet Take 1 tablet (40 mg total) by mouth daily. 90 tablet 2   ELDERBERRY PO Take by mouth.     fluticasone (FLONASE) 50 MCG/ACT nasal spray Place 2 sprays into both nostrils daily. 16 g 2   glipiZIDE (GLUCOTROL XL) 5 MG 24 hr tablet TAKE 2 TABLETS BY MOUTH  DAILY WITH BREAKFAST 180 tablet 3   glucose blood (ONETOUCH VERIO) test strip USE TO CHECK BLOOD SUGAR TWICE DAILY 200 strip 3   levocetirizine (XYZAL) 5 MG tablet Take 1 tablet (5 mg total) by mouth every evening. 90 tablet 3   lisinopril (ZESTRIL) 5 MG tablet Take 1 tablet (5 mg total) by mouth daily. 90 tablet 2   metFORMIN (GLUCOPHAGE) 1000 MG tablet Take 1 tablet (1,000 mg total) by mouth 2 (two) times daily with a meal. 180 tablet 2   metoprolol succinate (TOPROL-XL) 50 MG 24 hr tablet Take 1 tablet (50 mg total) by mouth daily. Take with or immediately following a meal. 90 tablet 3   Mometasone Furoate (ASMANEX HFA) 100 MCG/ACT AERO Take 2 puffs twice daily. 13 g 2   montelukast (SINGULAIR) 10 MG tablet Take 1 tablet (10 mg total) by mouth at bedtime. 30 tablet 3   nitroGLYCERIN (NITROSTAT) 0.4 MG SL tablet Place 1 tablet (0.4 mg total) under the tongue every 5 (five) minutes x 3 doses as needed for chest pain. 25 tablet 1   OneTouch Delica Lancets 81E MISC Use twice daily to check blood sugar.  DXE11.9 100 each 3   pantoprazole (PROTONIX) 20 MG tablet Take 1 tablet (20 mg total) by mouth daily. 90 tablet 2   pioglitazone (ACTOS) 30 MG tablet Take 1 tablet (30 mg total) by mouth daily. 90 tablet 2   vitamin C (ASCORBIC ACID) 500 MG tablet Take 500 mg by mouth 2 (two) times daily.     zaleplon (SONATA) 5  MG capsule Take 1 capsule (5 mg total) by mouth at bedtime as needed for sleep. 30 capsule 1   No current facility-administered medications for  this visit.   Known Allergies:  Allergies  Allergen Reactions   Clopidogrel Hives and Itching   Past Surgical History: Past Surgical History:  Procedure Laterality Date   CORONARY STENT PLACEMENT     Thinks left main, around age 55   Family History: Family History  Problem Relation Age of Onset   Heart attack Brother    Heart disease Brother    Hyperlipidemia Mother    Hypertension Father    Social History: [place] lives ***.   ROS:  All other systems negative except as noted per HPI.  Objective:  There were no vitals taken for this visit. There is no height or weight on file to calculate BMI. Physical Exam:  General Appearance:  Alert, cooperative, no distress, appears stated age  Head:  Normocephalic, without obvious abnormality, atraumatic  Eyes:  Conjunctiva clear, EOM's intact  Nose: Nares normal  Throat: Lips, tongue normal; teeth and gums normal  Neck: Supple, symmetrical  Lungs:   Respirations unlabored, no coughing  Heart:  Appears well perfused  Extremities: No edema  Skin: Skin color, texture, turgor normal, no rashes or lesions on visualized portions of skin  Neurologic: No gross deficits     Diagnostics: Spirometry:  Tracings reviewed. His effort: {Blank single:19197::"Good reproducible efforts.","It was hard to get consistent efforts and there is a question as to whether this reflects a maximal maneuver.","Poor effort, data can not be interpreted."} FVC: ***L FEV1: ***L, ***% predicted FEV1/FVC ratio: ***% Interpretation: {Blank single:19197::"Spirometry consistent with mild obstructive disease","Spirometry consistent with moderate obstructive disease","Spirometry consistent with severe obstructive disease","Spirometry consistent with possible restrictive disease","Spirometry consistent with mixed obstructive and restrictive disease","Spirometry uninterpretable due to technique","Spirometry consistent with normal pattern","No overt abnormalities noted  given today's efforts"}.  Please see scanned spirometry results for details.  Skin Testing: {Blank single:19197::"Select foods","Environmental allergy panel","Environmental allergy panel and select foods","Food allergy panel","None","Deferred due to recent antihistamines use"}. Positive test to: ***. Negative test to: ***.  Results discussed with patient/family.   {Blank single:19197::"Allergy testing results were read and interpreted by myself, documented by clinical staff."," "}  Assessment and Plan  There are no Patient Instructions on file for this visit.  No follow-ups on file.  {Blank single:19197::"This note in its entirety was forwarded to the Provider who requested this consultation."}  Thank you for your kind referral. I appreciate the opportunity to take part in Mikie's care. Please do not hesitate to contact me with questions.***  Sincerely,  Sigurd Sos, MD [facility]

## 2021-10-13 ENCOUNTER — Ambulatory Visit: Payer: Self-pay | Admitting: Internal Medicine

## 2021-10-17 DIAGNOSIS — R3989 Other symptoms and signs involving the genitourinary system: Secondary | ICD-10-CM | POA: Diagnosis not present

## 2021-11-14 ENCOUNTER — Encounter: Payer: Self-pay | Admitting: Family Medicine

## 2021-11-14 ENCOUNTER — Other Ambulatory Visit: Payer: Medicare Other

## 2021-11-14 ENCOUNTER — Ambulatory Visit (INDEPENDENT_AMBULATORY_CARE_PROVIDER_SITE_OTHER): Payer: Medicare Other | Admitting: Family Medicine

## 2021-11-14 VITALS — BP 110/64 | HR 61 | Temp 98.4°F | Ht 72.0 in | Wt 202.0 lb

## 2021-11-14 DIAGNOSIS — E1165 Type 2 diabetes mellitus with hyperglycemia: Secondary | ICD-10-CM | POA: Diagnosis not present

## 2021-11-14 DIAGNOSIS — B351 Tinea unguium: Secondary | ICD-10-CM | POA: Diagnosis not present

## 2021-11-14 DIAGNOSIS — F325 Major depressive disorder, single episode, in full remission: Secondary | ICD-10-CM

## 2021-11-14 DIAGNOSIS — R972 Elevated prostate specific antigen [PSA]: Secondary | ICD-10-CM

## 2021-11-14 LAB — HEPATIC FUNCTION PANEL
ALT: 22 U/L (ref 0–53)
AST: 14 U/L (ref 0–37)
Albumin: 4.4 g/dL (ref 3.5–5.2)
Alkaline Phosphatase: 124 U/L — ABNORMAL HIGH (ref 39–117)
Bilirubin, Direct: 0.2 mg/dL (ref 0.0–0.3)
Total Bilirubin: 1.1 mg/dL (ref 0.2–1.2)
Total Protein: 6.6 g/dL (ref 6.0–8.3)

## 2021-11-14 LAB — PSA: PSA: 1.64 ng/mL (ref 0.10–4.00)

## 2021-11-14 MED ORDER — TERBINAFINE HCL 250 MG PO TABS: 250.0000 mg | ORAL_TABLET | Freq: Every day | ORAL | 1 refills | Status: DC

## 2021-11-14 MED ORDER — TERBINAFINE HCL 250 MG PO TABS: 250.0000 mg | ORAL_TABLET | Freq: Every day | ORAL | 3 refills | Status: DC

## 2021-11-14 NOTE — Patient Instructions (Signed)
Give [place] 2-3 business days to get the results of your labs back.   There should be no cost issues with your new medication.   It can take 6 months for a new nail to grow.   Let us know if you need anything.

## 2021-11-14 NOTE — Progress Notes (Signed)
Chief Complaint  Patient presents with   Nail Problem    [name] is a 71 y.o. male here for a toenail complaint.  Duration:  1.5  years Location: L great toenail Pruritic? No Painful? No Drainage? No New soaps/lotions/topicals/detergents? No Sick contacts? No Other associated symptoms: no redness Therapies tried thus far: Topical lacquer  Past Medical History:  Diagnosis Date   Diabetes mellitus without complication (HCC)    Hyperlipidemia    Hypertension     BP 110/64    Pulse 61    Temp 98.4 F (36.9 C) (Oral)    Ht 6' (1.829 m)    Wt 202 lb (91.6 kg)    SpO2 99%    BMI 27.40 kg/m  Gen: awake, alert, appearing stated age Lungs: No accessory muscle use Skin: Thickened and yellow nail over the L great toe. No drainage, erythema, TTP, fluctuance, excoriation Psych: Age appropriate judgment and insight  Onychomycosis - Plan: Hepatic function panel, terbinafine (LAMISIL) 250 MG tablet  Increased prostate specific antigen (PSA) velocity - Plan: PSA  Major depressive disorder with single episode, in full remission ([place]), Chronic  Type 2 diabetes mellitus with hyperglycemia, without long-term current use of insulin ([place]), Chronic  Chronic, unstable. Start Lamisil 250 mg/d as he has failed topical therapy. Discussed lack of pain making removal less appropriate. If he has pain, will remove. Ck LFT's today and will repeat at his DM visit.  F/u as originally scheduled. The patient voiced understanding and agreement to the plan.  [place], DO 11/14/21 1:57 PM

## 2021-11-15 ENCOUNTER — Other Ambulatory Visit: Payer: Self-pay | Admitting: Family Medicine

## 2021-11-15 DIAGNOSIS — R748 Abnormal levels of other serum enzymes: Secondary | ICD-10-CM

## 2021-11-23 ENCOUNTER — Telehealth: Payer: Self-pay | Admitting: Cardiology

## 2021-11-23 NOTE — Telephone Encounter (Signed)
Patient's wife returning call. 

## 2021-11-23 NOTE — Telephone Encounter (Signed)
° °  Pt c/o swelling: STAT is pt has developed SOB within 24 hours  If swelling, where is the swelling located? Left hand  How much weight have you gained and in what time span? No   Have you gained 3 pounds in a day or 5 pounds in a week? Not sure  Do you have a log of your daily weights (if so, list)? No   Are you currently taking a fluid pill? No   Are you currently SOB? No   Have you traveled recently? No   Pt's wife said, pt's hand been swollen and she is concern that it might have been connected to pt's heart

## 2021-11-23 NOTE — Telephone Encounter (Signed)
Left VM to call back 

## 2021-11-23 NOTE — Telephone Encounter (Signed)
Spoke with Jonelle Sidle per DPR who states that the pts hand is swollen. Advised to see PCP as we cannot see him until 2/9. Pt is due for a follow up so appointment was made. Pt has had swelling for 1 month. Denies chest pain or shortness of breath.

## 2021-11-28 ENCOUNTER — Ambulatory Visit (INDEPENDENT_AMBULATORY_CARE_PROVIDER_SITE_OTHER): Payer: Medicare Other | Admitting: Family Medicine

## 2021-11-28 ENCOUNTER — Other Ambulatory Visit: Payer: Medicare Other

## 2021-11-28 ENCOUNTER — Encounter: Payer: Self-pay | Admitting: Family Medicine

## 2021-11-28 ENCOUNTER — Telehealth: Payer: Self-pay | Admitting: *Deleted

## 2021-11-28 VITALS — BP 108/68 | HR 54 | Temp 97.4°F | Ht 72.0 in | Wt 205.0 lb

## 2021-11-28 DIAGNOSIS — E1165 Type 2 diabetes mellitus with hyperglycemia: Secondary | ICD-10-CM

## 2021-11-28 DIAGNOSIS — R748 Abnormal levels of other serum enzymes: Secondary | ICD-10-CM

## 2021-11-28 DIAGNOSIS — I10 Essential (primary) hypertension: Secondary | ICD-10-CM | POA: Diagnosis not present

## 2021-11-28 DIAGNOSIS — M7989 Other specified soft tissue disorders: Secondary | ICD-10-CM

## 2021-11-28 LAB — HEPATIC FUNCTION PANEL
ALT: 16 U/L (ref 0–53)
AST: 11 U/L (ref 0–37)
Albumin: 4.3 g/dL (ref 3.5–5.2)
Alkaline Phosphatase: 110 U/L (ref 39–117)
Bilirubin, Direct: 0.1 mg/dL (ref 0.0–0.3)
Total Bilirubin: 0.6 mg/dL (ref 0.2–1.2)
Total Protein: 6.4 g/dL (ref 6.0–8.3)

## 2021-11-28 LAB — GAMMA GT: GGT: 16 U/L (ref 7–51)

## 2021-11-28 NOTE — Progress Notes (Signed)
Musculoskeletal Exam  Patient: Gary Barnett DOB: 1951-08-30  DOS: 11/28/2021  SUBJECTIVE:  Chief Complaint:   Chief Complaint  Patient presents with   Edema    hands    Gary Barnett is a 71 y.o.  male for evaluation and treatment of R hand swelling.  He is here with his wife.  Onset:  7 weeks ago. No inj or change in activity.  Location: R hand No pain.  Progression of issue:  is unchanged Associated symptoms: none Denies redness, bruising, hand pain, decreased range of motion, upper extremity pain, shortness of breath, chest pain Treatment: to date has been none.   Neurovascular symptoms: no  Past Medical History:  Diagnosis Date   Diabetes mellitus without complication ([place])    Hyperlipidemia    Hypertension     Objective: VITAL SIGNS: BP 108/68    Pulse (!) 54    Temp (!) 97.4 F (36.3 C) (Oral)    Ht 6' (1.829 m)    Wt 205 lb (93 kg)    SpO2 96%    BMI 27.80 kg/m  Constitutional: Well formed, well developed. No acute distress. Thorax & Lungs: No accessory muscle use Musculoskeletal: Right hand.   Normal active range of motion: yes.   Normal passive range of motion: yes Tenderness to palpation: no Deformity: no Ecchymosis: no Diffuse nonpitting edema noted over the right hand Neurologic: Normal sensory function.  Psychiatric: Normal mood. Age appropriate judgment and insight. Alert & oriented x 3.    Assessment:  Swelling of right hand  Essential hypertension - Plan: AMB Referral to Community Care Coordinaton  Type 2 diabetes mellitus with hyperglycemia, without long-term current use of insulin ([place]) - Plan: AMB Referral to [place]: Elevate hand, mind salt intake, wrap hand as well.  Unlikely due to stenosis, heart failure, gout, autoimmune etiology, trauma, arthritis, allergic reaction.  We will place referral to community care coordination to get a pharmacist to see if there could be any interaction.  He will let me know if  anything changes. The patient and his wife voiced understanding and agreement to the plan.   [place], DO 11/28/21  11:52 AM

## 2021-11-28 NOTE — Chronic Care Management (AMB) (Signed)
°  Chronic Care Management   Outreach Note  11/28/2021 Name: Gary Barnett MRN: [id number] DOB: 12/07/1950  Gary Barnett is a 72 y.o. year old male who is a primary care patient of Shelda Pal, DO. I reached out to Gary Barnett by phone today in response to a referral sent by Gary Barnett primary care provider.  An unsuccessful telephone outreach was attempted today. The patient was referred to the case management team for assistance with care management and care coordination.   Follow Up Plan: A HIPAA compliant phone message was left for the patient providing contact information and requesting a return call.  If patient returns call to provider office, please advise to call Embedded Care Management Care Guide Ulice Follett at [place] [place] [facility]  Direct Dial: [phone]

## 2021-11-28 NOTE — Patient Instructions (Addendum)
Elevate your hand for 10-15 min when able.   Wrap the hand.   Mind the salt intake.   Someone should reach out in the next week or so. If you don't hear anything, send a message or call.   Let us know if you need anything.

## 2021-11-28 NOTE — Addendum Note (Signed)
Addended by: Kelle Darting A on: 11/28/2021 11:54 AM   Modules accepted: Orders

## 2021-11-29 ENCOUNTER — Other Ambulatory Visit: Payer: Medicare Other

## 2021-11-29 ENCOUNTER — Ambulatory Visit: Payer: Medicare Other | Admitting: Family Medicine

## 2021-11-29 NOTE — Progress Notes (Addendum)
NEW PATIENT Date of Service/Encounter:  12/01/21 Referring provider: Shelda Barnett* Primary care provider: Shelda Pal, DO  Subjective:  Gary Barnett is a 71 y.o. male with a PMHx of CAD, DM2, HTN, hyperlipidemia, GAD, diverticulosis, vitamin D deficiency, osteoarthritis, BPH presenting today for evaluation of chronic rhinitis. History obtained from: chart review and patient.   Chronic rhinitis: started over 30 years ago Symptoms include:  nasal congestion in the morning time, but when he starts moving around it starts to run  , at night asymptomatic, laying down his symptoms seem to resolve Occurs year-round Potential triggers: unidentified Treatments tried: Zyrtec, Benadryl, Claritin, Xyzal, INCS, singulair-don't help, currently taking flonase, singulair, no antihistmaines Previous allergy testing: no-would like to confirm cost prior to testing History of reflux/heartburn:  yes- takes protonix once a day, controlled if he eats slow   Other allergy screening: Asthma: no Food allergy: no Medication allergy:  plavix-breaks out into hives Hymenoptera allergy: no Urticaria: no Eczema:no  Past Medical History: Past Medical History:  Diagnosis Date   Diabetes mellitus without complication ([place])    Hyperlipidemia    Hypertension    Medication List:  Current Outpatient Medications  Medication Sig Dispense Refill   Alcohol Swabs (B-D SINGLE USE SWABS REGULAR) PADS Use daily when checking blood sugar 100 each 3   aspirin 325 MG tablet Take 325 mg by mouth.     atorvastatin (LIPITOR) 80 MG tablet Take 1 tablet (80 mg total) by mouth daily. 90 tablet 2   Blood Glucose Monitoring Suppl (ONETOUCH VERIO) w/Device KIT Use twice daily to check blood sugar.  DX E11.9 1 kit 0   citalopram (CELEXA) 40 MG tablet Take 1 tablet (40 mg total) by mouth daily. 90 tablet 2   ELDERBERRY PO Take by mouth.     fluticasone (FLONASE) 50 MCG/ACT nasal spray Place 2 sprays into  both nostrils daily. 16 g 2   fluticasone (FLONASE) 50 MCG/ACT nasal spray Place into the nose.     glipiZIDE (GLUCOTROL XL) 5 MG 24 hr tablet TAKE 2 TABLETS BY MOUTH  DAILY WITH BREAKFAST 180 tablet 3   glucose blood (ONETOUCH VERIO) test strip USE TO CHECK BLOOD SUGAR TWICE DAILY 200 strip 3   ipratropium (ATROVENT) 0.03 % nasal spray Place 2 sprays into both nostrils 3 (three) times daily as needed for rhinitis. 30 mL 5   levocetirizine (XYZAL) 5 MG tablet Take 1 tablet (5 mg total) by mouth every evening. 90 tablet 3   lisinopril (ZESTRIL) 5 MG tablet Take 1 tablet (5 mg total) by mouth daily. 90 tablet 2   metFORMIN (GLUCOPHAGE) 1000 MG tablet Take 1 tablet (1,000 mg total) by mouth 2 (two) times daily with a meal. 180 tablet 2   metoprolol succinate (TOPROL-XL) 50 MG 24 hr tablet Take 1 tablet (50 mg total) by mouth daily. Take with or immediately following a meal. 90 tablet 3   Mometasone Furoate (ASMANEX HFA) 100 MCG/ACT AERO Take 2 puffs twice daily. 13 g 2   montelukast (SINGULAIR) 10 MG tablet Take 1 tablet (10 mg total) by mouth at bedtime. 30 tablet 3   nitroGLYCERIN (NITROSTAT) 0.4 MG SL tablet Place 1 tablet (0.4 mg total) under the tongue every 5 (five) minutes x 3 doses as needed for chest pain. 25 tablet 1   OneTouch Delica Lancets 16W MISC Use twice daily to check blood sugar.  DXE11.9 100 each 3   pantoprazole (PROTONIX) 20 MG tablet Take 1 tablet (20 mg  total) by mouth daily. 90 tablet 2   PAXLOVID, 300/100, 20 x 150 MG & 10 x 100MG TBPK See admin instructions.     pioglitazone (ACTOS) 30 MG tablet Take 1 tablet (30 mg total) by mouth daily. 90 tablet 2   sertraline (ZOLOFT) 50 MG tablet Take 1 tablet by mouth daily.     terbinafine (LAMISIL) 250 MG tablet Take 1 tablet (250 mg total) by mouth daily. 90 tablet 1   vitamin C (ASCORBIC ACID) 500 MG tablet Take 500 mg by mouth 2 (two) times daily.     zaleplon (SONATA) 5 MG capsule Take 1 capsule (5 mg total) by mouth at bedtime  as needed for sleep. 30 capsule 1   No current facility-administered medications for this visit.   Known Allergies:  Allergies  Allergen Reactions   Clopidogrel Hives and Itching   Past Surgical History: Past Surgical History:  Procedure Laterality Date   CORONARY STENT PLACEMENT     Thinks left main, around age 61   Family History: Family History  Problem Relation Age of Onset   Heart attack Brother    Heart disease Brother    Hyperlipidemia Mother    Hypertension Father    Social History: Gary Barnett lives with his wife, quit smoking in 1994 (1 ppd x 25 yrs).  Carpet in home, 71 years old, no water damage, heat pump gas, central AC, cats outside, no cockroaches, dust mite protection on bedding not pillows, he is retired.  No HEPA filter in the home.  [place]  ROS:  All other systems negative except as noted per HPI.  Objective:  Blood pressure 130/74, pulse 64, temperature 98.1 F (36.7 C), temperature source Temporal, resp. rate 17, height 6' (1.829 m), weight 205 lb 9.6 oz (93.3 kg), SpO2 99 %. Body mass index is 27.88 kg/m. Physical Exam:  General Appearance:  Alert, cooperative, no distress, appears stated age  Head:  Normocephalic, without obvious abnormality, atraumatic  Eyes:  Conjunctiva clear, EOM's intact  Nose: Nares normal, normal mucosa and no visible anterior polyps  Throat: Lips, tongue normal; teeth and gums normal, normal posterior oropharynx  Neck: Supple, symmetrical  Lungs:   clear to auscultation bilaterally, Respirations unlabored, no coughing  Heart:  regular rate and rhythm and no murmur, Appears well perfused  Extremities: No edema  Skin: Skin color, texture, turgor normal, no rashes or lesions on visualized portions of skin  Neurologic: No gross deficits     Diagnostics: Skin Testing:  Deferred-patient would like to confirm coverage with insurance .   Assessment and Plan   Patient Instructions  Chronic  Rhinitis: - allergy testing today was deferred, patient to call insurance with codes provided to verify coverage - Continue Nasal Steroid Spray: Options include Flonase (fluticasone), Nasocort (triamcinolone), Nasonex (mometasome) 1- 2 sprays in each nostril daily (can buy over-the-counter if not covered by insurance)  Best results if used daily. - Start Atrovent (Ipratropium Bromide) 1-2 sprays in each nostril up to 3 times a day as needed for runny nose/post nasal drip/drainage.  Use less frequently if airway gets too dry. - Continue Singulair (Montelukast) 54m daily. - Continue over the counter antihistamine daily or daily as needed.   -Your options include Zyrtec (Cetirizine) 161m Claritin (Loratadine) 101mAllegra (Fexofenadine) 180m3mr Xyzal (Levocetirinze) 5mg 32mhedule follow-up for allergy testing after calling insurance.  Avoid antihistamines 3 days prior to this appointment.  It was a pleasure meeting you in clinic today!  Sigurd Sos, MD [facility]    This note in its entirety was forwarded to the Provider who requested this consultation.  Thank you for your kind referral. I appreciate the opportunity to take part in Gary Barnett's care. Please do not hesitate to contact me with questions.  Sincerely,  Sigurd Sos, MD [facility]

## 2021-11-30 NOTE — Chronic Care Management (AMB) (Signed)
Chronic Care Management   Note  11/30/2021 Name: Gary Barnett MRN: [id number] DOB: [birth year]  Gary Barnett is a 71 y.o. year old male who is a primary care patient of Shelda Pal, DO. I reached out to [name] by phone today in response to a referral sent by [name].  [name] was given information about Chronic Care Management services today including:  CCM service includes personalized support from designated clinical staff supervised by his physician, including individualized plan of care and coordination with other care providers 24/7 contact phone numbers for assistance for urgent and routine care needs. Service will only be billed when office clinical staff spend 20 minutes or more in a month to coordinate care. Only one practitioner may furnish and bill the service in a calendar month. The patient may stop CCM services at any time (effective at the end of the month) by phone call to the office staff. The patient is responsible for co-pay (up to 20% after annual deductible is met) if co-pay is required by the individual health plan.   Patient agreed to services and verbal consent obtained.   Follow up plan: Telephone appointment with care management team member scheduled for: 12/27/2021  Julian Hy, [facility] [facility]  Direct Dial: [phone]

## 2021-12-01 ENCOUNTER — Ambulatory Visit (INDEPENDENT_AMBULATORY_CARE_PROVIDER_SITE_OTHER): Payer: Medicare Other | Admitting: Internal Medicine

## 2021-12-01 ENCOUNTER — Encounter: Payer: Self-pay | Admitting: Internal Medicine

## 2021-12-01 ENCOUNTER — Other Ambulatory Visit: Payer: Self-pay

## 2021-12-01 VITALS — BP 130/74 | HR 64 | Temp 98.1°F | Resp 17 | Ht 72.0 in | Wt 205.6 lb

## 2021-12-01 DIAGNOSIS — J310 Chronic rhinitis: Secondary | ICD-10-CM | POA: Diagnosis not present

## 2021-12-01 LAB — ALKALINE PHOSPHATASE ISOENZYMES
Alkaline phosphatase (APISO): 107 U/L (ref 35–144)
Bone Isoenzymes: 24 % — ABNORMAL LOW (ref 28–66)
Intestinal Isoenzymes: 10 % (ref 1–24)
Liver Isoenzymes: 66 % (ref 25–69)
Macrohepatic isoenzymes: 0 % (ref <=0)
Placental isoenzymes: 0 % (ref <=0)

## 2021-12-01 MED ORDER — IPRATROPIUM BROMIDE 0.03 % NA SOLN: 2.0000 | Freq: Three times a day (TID) | NASAL | 5 refills | Status: DC | PRN

## 2021-12-01 NOTE — Patient Instructions (Addendum)
Chronic Rhinitis: - allergy testing today was deferred, patient to call insurance with codes provided to verify coverage - Continue Nasal Steroid Spray: Options include Flonase (fluticasone), Nasocort (triamcinolone), Nasonex (mometasome) 1- 2 sprays in each nostril daily (can buy over-the-counter if not covered by insurance)  Best results if used daily. - Start Atrovent (Ipratropium Bromide) 1-2 sprays in each nostril up to 3 times a day as needed for runny nose/post nasal drip/drainage.  Use less frequently if airway gets too dry. - Continue Singulair (Montelukast) 10mg  daily. - Continue over the counter antihistamine daily or daily as needed.   -Your options include Zyrtec (Cetirizine) 10mg , Claritin (Loratadine) 10mg , Allegra (Fexofenadine) 180mg , or Xyzal (Levocetirinze) 5mg   Schedule follow-up for allergy testing after calling insurance.  Avoid antihistamines 3 days prior to this appointment.  It was a pleasure meeting you in clinic today!  Sigurd Sos, MD [facility]

## 2021-12-08 ENCOUNTER — Other Ambulatory Visit: Payer: Self-pay

## 2021-12-08 DIAGNOSIS — I10 Essential (primary) hypertension: Secondary | ICD-10-CM | POA: Insufficient documentation

## 2021-12-08 DIAGNOSIS — E785 Hyperlipidemia, unspecified: Secondary | ICD-10-CM | POA: Insufficient documentation

## 2021-12-14 ENCOUNTER — Other Ambulatory Visit: Payer: Self-pay

## 2021-12-14 ENCOUNTER — Encounter: Payer: Self-pay | Admitting: Cardiology

## 2021-12-14 ENCOUNTER — Ambulatory Visit: Payer: Medicare Other | Admitting: Cardiology

## 2021-12-14 VITALS — BP 124/64 | HR 58 | Ht 72.0 in | Wt 204.1 lb

## 2021-12-14 DIAGNOSIS — E088 Diabetes mellitus due to underlying condition with unspecified complications: Secondary | ICD-10-CM | POA: Diagnosis not present

## 2021-12-14 DIAGNOSIS — E782 Mixed hyperlipidemia: Secondary | ICD-10-CM

## 2021-12-14 DIAGNOSIS — I2510 Atherosclerotic heart disease of native coronary artery without angina pectoris: Secondary | ICD-10-CM | POA: Diagnosis not present

## 2021-12-14 DIAGNOSIS — I10 Essential (primary) hypertension: Secondary | ICD-10-CM

## 2021-12-14 NOTE — Progress Notes (Signed)
Cardiology Office Note:    Date:  12/14/2021   ID:  Leanora Barnett, DOB December 22, 1950, MRN [id number]  PCP:  Shelda Pal, DO  Cardiologist:  Jenean Lindau, MD   Referring MD: Shelda Pal*    ASSESSMENT:    1. Coronary artery disease involving native coronary artery of native heart without angina pectoris   2. Essential hypertension   3. Diabetes mellitus due to underlying condition with unspecified complications ([place])   4. Mixed hyperlipidemia    PLAN:    In order of problems listed above:  Coronary artery disease: Secondary prevention stressed to the patient.  Importance of compliance with diet and medications stressed and he vocalized understanding.  He was advised to walk to the best of his ability.  Because of his symptoms of chest discomfort we will do an exercise stress Cardiolite.  He is agreeable. Essential hypertension: Blood pressure stable and diet was emphasized.  Lifestyle modification encouraged. Cardiac murmur: Echocardiogram will be done to assess murmur heard in the auscultation. Mixed dyslipidemia and diabetes mellitus: Stable and managed by primary care physician.  Lifestyle modification and diet was emphasized and he understands. Patient will be seen in follow-up appointment in 6 months or earlier if the patient has any concerns    Medication Adjustments/Labs and Tests Ordered: Current medicines are reviewed at length with the patient today.  Concerns regarding medicines are outlined above.  No orders of the defined types were placed in this encounter.  No orders of the defined types were placed in this encounter.    No chief complaint on file.    History of Present Illness:    Gary Barnett is a 71 y.o. male.  Patient has past medical history of coronary artery disease postangioplasty in the remote past, essential hypertension, mixed dyslipidemia and diabetes mellitus.  He denies any problems at this time and takes care of  activities of daily living.  He occasionally has chest tightness though this is not related to exertion.  At the time of my evaluation, the patient is alert awake oriented and in no distress.  He has not used nitroglycerin in the past several months.  He wants to be evaluated for the symptoms.  Past Medical History:  Diagnosis Date   Anxiety 11/11/2017   Benign prostatic hyperplasia 04/13/2014   Formatting of this note might be different from the original. 10/1 IMO update   CAD (coronary artery disease) 09/30/2015   Change in bowel habits 06/20/2016   Diabetes mellitus due to underlying condition with unspecified complications ([place]) 02/11/8118   Diabetes mellitus without complication ([place])    Encounter for colonoscopy due to history of adenomatous colonic polyps 05/20/2014   Essential hypertension 08/09/2014   GAD (generalized anxiety disorder) 05/04/2020   Hyperlipemia 08/09/2014   Hyperlipidemia    Hypertension    Insomnia 06/03/2019   Major depressive disorder with single episode, in full remission ([place]) 11/11/2017   Malaise 03/20/2018   Primary osteoarthritis involving multiple joints 11/11/2017   Risk for falls 01/23/2016   Screening for malignant neoplasm of prostate 03/20/2018   Seasonal allergies 08/03/2021   Sigmoid diverticulosis 07/08/2014   Type 2 diabetes mellitus with hyperglycemia, without long-term current use of insulin ([place]) 11/21/2015   Vitamin D deficiency 08/09/2014    Past Surgical History:  Procedure Laterality Date   CORONARY STENT PLACEMENT     Thinks left main, around age 72    Current Medications: Current Meds  Medication Sig   aspirin 325 MG  tablet Take 325 mg by mouth daily.   atorvastatin (LIPITOR) 80 MG tablet Take 1 tablet (80 mg total) by mouth daily.   Blood Glucose Monitoring Suppl (ONETOUCH VERIO) w/Device KIT Use twice daily to check blood sugar.  DX E11.9   citalopram (CELEXA) 40 MG tablet Take 1 tablet (40 mg total) by mouth daily.   ELDERBERRY PO Take 1  capsule by mouth daily.   glipiZIDE (GLUCOTROL XL) 5 MG 24 hr tablet TAKE 2 TABLETS BY MOUTH  DAILY WITH BREAKFAST   glucose blood (ONETOUCH VERIO) test strip USE TO CHECK BLOOD SUGAR TWICE DAILY   ipratropium (ATROVENT) 0.03 % nasal spray Place 2 sprays into both nostrils 3 (three) times daily as needed for rhinitis.   lisinopril (ZESTRIL) 5 MG tablet Take 1 tablet (5 mg total) by mouth daily.   metFORMIN (GLUCOPHAGE) 1000 MG tablet Take 1 tablet (1,000 mg total) by mouth 2 (two) times daily with a meal.   metoprolol succinate (TOPROL-XL) 50 MG 24 hr tablet Take 1 tablet (50 mg total) by mouth daily. Take with or immediately following a meal.   montelukast (SINGULAIR) 10 MG tablet Take 1 tablet (10 mg total) by mouth at bedtime.   nitroGLYCERIN (NITROSTAT) 0.4 MG SL tablet Place 1 tablet (0.4 mg total) under the tongue every 5 (five) minutes x 3 doses as needed for chest pain.   OneTouch Delica Lancets 14H MISC Use twice daily to check blood sugar.  DXE11.9   pantoprazole (PROTONIX) 20 MG tablet Take 1 tablet (20 mg total) by mouth daily.   pioglitazone (ACTOS) 30 MG tablet Take 1 tablet (30 mg total) by mouth daily.   terbinafine (LAMISIL) 250 MG tablet Take 1 tablet (250 mg total) by mouth daily.   vitamin C (ASCORBIC ACID) 500 MG tablet Take 500 mg by mouth 2 (two) times daily.     Allergies:   Clopidogrel   Social History   Socioeconomic History   Marital status: Married    Spouse name: Not on file   Number of children: Not on file   Years of education: Not on file   Highest education level: Not on file  Occupational History   Not on file  Tobacco Use   Smoking status: Former    Packs/day: 1.00    Years: 25.00    Pack years: 25.00    Types: Cigarettes    Quit date: 66    Years since quitting: 29.1   Smokeless tobacco: Never  Vaping Use   Vaping Use: Never used  Substance and Sexual Activity   Alcohol use: No   Drug use: No   Sexual activity: Not on file  Other Topics  Concern   Not on file  Social History Narrative   Not on file   Social Determinants of Health   Financial Resource Strain: Not on file  Food Insecurity: Not on file  Transportation Needs: Not on file  Physical Activity: Not on file  Stress: Not on file  Social Connections: Not on file     Family History: The patient's family history includes Heart attack in his brother; Heart disease in his brother; Hyperlipidemia in his mother; Hypertension in his father.  ROS:   Please see the history of present illness.    All other systems reviewed and are negative.  EKGs/Labs/Other Studies Reviewed:    The following studies were reviewed today: I discussed my findings with the patient at length.   Recent Labs: 10/02/2021: BUN 16; Creatinine, Ser  0.81; Hemoglobin 13.8; Platelets 204.0; Potassium 4.2; Sodium 140 11/28/2021: ALT 16  Recent Lipid Panel    Component Value Date/Time   CHOL 109 10/02/2021 1359   CHOL 149 02/16/2020 1139   TRIG 116.0 10/02/2021 1359   HDL 34.30 (L) 10/02/2021 1359   HDL 30 (L) 02/16/2020 1139   CHOLHDL 3 10/02/2021 1359   VLDL 23.2 10/02/2021 1359   LDLCALC 51 10/02/2021 1359   LDLCALC 40 08/09/2020 1359    Physical Exam:    VS:  BP 124/64    Pulse (!) 58    Ht 6' (1.829 m)    Wt 204 lb 1.3 oz (92.6 kg)    SpO2 97%    BMI 27.68 kg/m     Wt Readings from Last 3 Encounters:  12/14/21 204 lb 1.3 oz (92.6 kg)  12/01/21 205 lb 9.6 oz (93.3 kg)  11/28/21 205 lb (93 kg)     GEN: Patient is in no acute distress HEENT: Normal NECK: No JVD; No carotid bruits LYMPHATICS: No lymphadenopathy CARDIAC: Hear sounds regular, 2/6 systolic murmur at the apex. RESPIRATORY:  Clear to auscultation without rales, wheezing or rhonchi  ABDOMEN: Soft, non-tender, non-distended MUSCULOSKELETAL:  No edema; No deformity  SKIN: Warm and dry NEUROLOGIC:  Alert and oriented x 3 PSYCHIATRIC:  Normal affect   Signed, Jenean Lindau, MD  12/14/2021 1:44 PM    [facility]

## 2021-12-14 NOTE — Patient Instructions (Signed)
Medication Instructions:  Your physician recommends that you continue on your current medications as directed. Please refer to the Current Medication list given to you today.  *If you need a refill on your cardiac medications before your next appointment, please call your pharmacy*   Lab Work: None ordered If you have labs (blood work) drawn today and your tests are completely normal, you will receive your results only by: [place] (if you have MyChart) OR A paper copy in the mail If you have any lab test that is abnormal or we need to change your treatment, we will call you to review the results.   Testing/Procedures: Your physician has requested that you have a lexiscan myoview. For further information please visit [email or web address]. Please follow instruction sheet, as given.  The test will take approximately 3 to 4 hours to complete; you may bring reading material.  If someone comes with you to your appointment, they will need to remain in the main lobby due to limited space in the testing area.   How to prepare for your Myocardial Perfusion Test: Do not eat or drink 3 hours prior to your test, except you may have water. Do not consume products containing caffeine (regular or decaffeinated) 12 hours prior to your test. (ex: coffee, chocolate, sodas, tea). Do bring a list of your current medications with you.  If not listed below, you may take your medications as normal. Do not take metoprolol (Lopressor, Toprol) for 24 hours prior to the test.  Bring the medication to your appointment as you may be required to take it once the test is complete. Do wear comfortable clothes (no dresses or overalls) and walking shoes, tennis shoes preferred (No heels or open toe shoes are allowed). Do NOT wear cologne, perfume, aftershave, or lotions (deodorant is allowed). If these instructions are not followed, your test will have to be rescheduled.  Your physician has requested that you have  an echocardiogram. Echocardiography is a painless test that uses sound waves to create images of your heart. It provides your doctor with information about the size and shape of your heart and how well your hearts chambers and valves are working. This procedure takes approximately one hour. There are no restrictions for this procedure.   Follow-Up: At [facility], you and your health needs are our priority.  As part of our continuing mission to provide you with exceptional heart care, we have created designated Provider Care Teams.  These Care Teams include your primary Cardiologist (physician) and Advanced Practice Providers (APPs -  Physician Assistants and Nurse Practitioners) who all work together to provide you with the care you need, when you need it.  We recommend signing up for the patient portal called "MyChart".  Sign up information is provided on this After Visit Summary.  MyChart is used to connect with patients for Virtual Visits (Telemedicine).  Patients are able to view lab/test results, encounter notes, upcoming appointments, etc.  Non-urgent messages can be sent to your provider as well.   To learn more about what you can do with MyChart, go to [email or web address].    Your next appointment:   6 month(s)  The format for your next appointment:   In Person  Provider:   Jyl Heinz, MD   Other Instructions Cardiac Nuclear Scan A cardiac nuclear scan is a test that is done to check the flow of blood to your heart. It is done when you are resting and when you are exercising. The test looks  for problems such as: Not enough blood reaching a portion of the heart. The heart muscle not working as it should. You may need this test if: You have heart disease. You have had lab results that are not normal. You have had heart surgery or a balloon procedure to open up blocked arteries (angioplasty). You have chest pain. You have shortness of breath. In this test, a special  dye (tracer) is put into your bloodstream. The tracer will travel to your heart. A camera will then take pictures of your heart to see how the tracer moves through your heart. This test is usually done at a hospital and takes 2-4 hours. Tell a doctor about: Any allergies you have. All medicines you are taking, including vitamins, herbs, eye drops, creams, and over-the-counter medicines. Any problems you or family members have had with anesthetic medicines. Any blood disorders you have. Any surgeries you have had. Any medical conditions you have. Whether you are pregnant or may be pregnant. What are the risks? Generally, this is a safe test. However, problems may occur, such as: Serious chest pain and heart attack. This is only a risk if the stress portion of the test is done. Rapid heartbeat. A feeling of warmth in your chest. This feeling usually does not last long. Allergic reaction to the tracer. What happens before the test? Ask your doctor about changing or stopping your normal medicines. This is important. Follow instructions from your doctor about what you cannot eat or drink. Remove your jewelry on the day of the test. What happens during the test? An IV tube will be inserted into one of your veins. Your doctor will give you a small amount of tracer through the IV tube. You will wait for 20-40 minutes while the tracer moves through your bloodstream. Your heart will be monitored with an electrocardiogram (ECG). You will lie down on an exam table. Pictures of your heart will be taken for about 15-20 minutes. You may also have a stress test. For this test, one of these things may be done: You will be asked to exercise on a treadmill or a stationary bike. You will be given medicines that will make your heart work harder. This is done if you are unable to exercise. When blood flow to your heart has peaked, a tracer will again be given through the IV tube. After 20-40 minutes, you  will get back on the exam table. More pictures will be taken of your heart. Depending on the tracer that is used, more pictures may need to be taken 3-4 hours later. Your IV tube will be removed when the test is over. The test may vary among doctors and hospitals. What happens after the test? Ask your doctor: Whether you can return to your normal schedule, including diet, activities, and medicines. Whether you should drink more fluids. This will help to remove the tracer from your body. Drink enough fluid to keep your pee (urine) pale yellow. Ask your doctor, or the department that is doing the test: When will my results be ready? How will I get my results? Summary A cardiac nuclear scan is a test that is done to check the flow of blood to your heart. Tell your doctor whether you are pregnant or may be pregnant. Before the test, ask your doctor about changing or stopping your normal medicines. This is important. Ask your doctor whether you can return to your normal activities. You may be asked to drink more fluids. This information  is not intended to replace advice given to you by your health care provider. Make sure you discuss any questions you have with your health care provider. Document Revised: 02/11/2019 Document Reviewed: 04/07/2018 Elsevier Patient Education  2021 [place].    Echocardiogram An echocardiogram is a test that uses sound waves (ultrasound) to produce images of the heart. Images from an echocardiogram can provide important information about: Heart size and shape. The size and thickness and movement of your heart's walls. Heart muscle function and strength. Heart valve function or if you have stenosis. Stenosis is when the heart valves are too narrow. If blood is flowing backward through the heart valves (regurgitation). A tumor or infectious growth around the heart valves. Areas of heart muscle that are not working well because of poor blood flow or injury  from a heart attack. Aneurysm detection. An aneurysm is a weak or damaged part of an artery wall. The wall bulges out from the normal force of blood pumping through the body. Tell a health care provider about: Any allergies you have. All medicines you are taking, including vitamins, herbs, eye drops, creams, and over-the-counter medicines. Any blood disorders you have. Any surgeries you have had. Any medical conditions you have. Whether you are pregnant or may be pregnant. What are the risks? Generally, this is a safe test. However, problems may occur, including an allergic reaction to dye (contrast) that may be used during the test. What happens before the test? No specific preparation is needed. You may eat and drink normally. What happens during the test? You will take off your clothes from the waist up and put on a hospital gown. Electrodes or electrocardiogram (ECG)patches may be placed on your chest. The electrodes or patches are then connected to a device that monitors your heart rate and rhythm. You will lie down on a table for an ultrasound exam. A gel will be applied to your chest to help sound waves pass through your skin. A handheld device, called a transducer, will be pressed against your chest and moved over your heart. The transducer produces sound waves that travel to your heart and bounce back (or "echo" back) to the transducer. These sound waves will be captured in real-time and changed into images of your heart that can be viewed on a video monitor. The images will be recorded on a computer and reviewed by your health care provider. You may be asked to change positions or hold your breath for a short time. This makes it easier to get different views or better views of your heart. In some cases, you may receive contrast through an IV in one of your veins. This can improve the quality of the pictures from your heart. The procedure may vary among health care providers and  hospitals.    What can I expect after the test? You may return to your normal, everyday life, including diet, activities, and medicines, unless your health care provider tells you not to do that. Follow these instructions at home: It is up to you to get the results of your test. Ask your health care provider, or the department that is doing the test, when your results will be ready. Keep all follow-up visits. This is important. Summary An echocardiogram is a test that uses sound waves (ultrasound) to produce images of the heart. Images from an echocardiogram can provide important information about the size and shape of your heart, heart muscle function, heart valve function, and other possible heart problems.  You do not need to do anything to prepare before this test. You may eat and drink normally. After the echocardiogram is completed, you may return to your normal, everyday life, unless your health care provider tells you not to do that. This information is not intended to replace advice given to you by your health care provider. Make sure you discuss any questions you have with your health care provider. Document Revised: 06/14/2020 Document Reviewed: 06/14/2020 Elsevier Patient Education  2021 [employer].

## 2021-12-18 ENCOUNTER — Telehealth: Payer: Self-pay | Admitting: Cardiology

## 2021-12-18 NOTE — Telephone Encounter (Signed)
Patient's wife wants to know how long he will be walking on the treadmill for his stress test. She states that he does have a hard time walking and wants to know if this will affect his test as well as the echo. Please advise.

## 2021-12-19 NOTE — Telephone Encounter (Signed)
Spoke with pts wife who states the pt will be unable to walk on the treadmill for the stress test. She states it needs to be a lexiscan. Order will be changed. Pt's wife would like pt to have a RX for his anxiety that the medication causes him. How do you advise?

## 2021-12-19 NOTE — Telephone Encounter (Signed)
Dr. Geraldo Pitter spoke with pt's wife and answered questions.

## 2021-12-19 NOTE — Addendum Note (Signed)
Addended by: Truddie Hidden on: 12/19/2021 09:11 AM   Modules accepted: Orders

## 2021-12-22 ENCOUNTER — Telehealth: Payer: Self-pay | Admitting: Pharmacist

## 2021-12-22 NOTE — Chronic Care Management (AMB) (Signed)
Chronic Care Management Pharmacy Assistant   Name: Warren Lindahl  MRN: [id number] DOB: 1951-07-14  Gary Barnett is an 71 y.o. year old male who presents for his initial CCM visit with the clinical pharmacist.  Recent office visits:  11/28/21-Nicholas Nolene Ebbs, DO (PCP) Seen for edema. AMB Referral to [facility]. 11/14/21-Nicholas Nolene Ebbs, DO (PCP) Seen for a nail problem. Labs ordered. Start on terbinafine (LAMISIL) 250 MG tablet for onychomycosis.  10/02/21-Nicholas Nolene Ebbs, DO (PCP) Seen for a 6 month follow up visit. Labs ordered. Given Pneumococcal conjugate vaccine 20-valent (Prevnar 20). Follow up in 6 months.  08/25/21-Nicholas Nolene Ebbs, DO (PCP, Video visit) Seen for COVID-19 issues. Start on Mometasone Furoate [facility] HFA) 100 MCG/ACT AERO, benzonatate (TESSALON) 200 MG capsule. 08/03/21-Nicholas Nolene Ebbs, DO (PCP, Video visit) Seen for allergies. Start on benzonatate (TESSALON) 200 MG capsule and montelukast (SINGULAIR) 10 MG tablet. 07/29/21-(Urgent care) [place]. Seen for suspected COVID-19 infection.  07/04/21-Nicholas Nolene Ebbs, DO (PCP, Video visit) Seen for allergies. Start on  fluticasone (FLONASE) 50 MCG/ACT nasal spray and singulair.  Recent consult visits:  12/14/21-(Cardiology) Reita Cliche. Revankar, MD. General follow up visit. Follow up in 6 months.  12/01/21-(Allergy Specialist) Sigurd Sos, MD. Seen for chronic rhinitis. Start Atrovent (Ipratropium Bromide) 1-2 sprays in each nostril up to 3 times a day as needed for runny nose/post nasal drip/drainage. 10/17/21-(Urology) Ferdie Ping, MD. Annual check up.  07/27/21-(General surgery) Tommie Sams, [place] [facility] visits:  None in previous 6 months  Medications: Outpatient Encounter Medications as of 12/22/2021  Medication Sig   aspirin 325 MG tablet Take 325 mg by mouth daily.   atorvastatin (LIPITOR) 80 MG tablet Take 1 tablet (80 mg  total) by mouth daily.   Blood Glucose Monitoring Suppl (ONETOUCH VERIO) w/Device KIT Use twice daily to check blood sugar.  DX E11.9   citalopram (CELEXA) 40 MG tablet Take 1 tablet (40 mg total) by mouth daily.   ELDERBERRY PO Take 1 capsule by mouth daily.   fluticasone (FLONASE) 50 MCG/ACT nasal spray Place 2 sprays into both nostrils daily. (Patient not taking: Reported on 12/14/2021)   glipiZIDE (GLUCOTROL XL) 5 MG 24 hr tablet TAKE 2 TABLETS BY MOUTH  DAILY WITH BREAKFAST   glucose blood (ONETOUCH VERIO) test strip USE TO CHECK BLOOD SUGAR TWICE DAILY   ipratropium (ATROVENT) 0.03 % nasal spray Place 2 sprays into both nostrils 3 (three) times daily as needed for rhinitis.   levocetirizine (XYZAL) 5 MG tablet Take 1 tablet (5 mg total) by mouth every evening. (Patient not taking: Reported on 12/14/2021)   lisinopril (ZESTRIL) 5 MG tablet Take 1 tablet (5 mg total) by mouth daily.   metFORMIN (GLUCOPHAGE) 1000 MG tablet Take 1 tablet (1,000 mg total) by mouth 2 (two) times daily with a meal.   metoprolol succinate (TOPROL-XL) 50 MG 24 hr tablet Take 1 tablet (50 mg total) by mouth daily. Take with or immediately following a meal.   Mometasone Furoate (ASMANEX HFA) 100 MCG/ACT AERO Take 2 puffs twice daily. (Patient not taking: Reported on 12/14/2021)   montelukast (SINGULAIR) 10 MG tablet Take 1 tablet (10 mg total) by mouth at bedtime.   nitroGLYCERIN (NITROSTAT) 0.4 MG SL tablet Place 1 tablet (0.4 mg total) under the tongue every 5 (five) minutes x 3 doses as needed for chest pain.   OneTouch Delica Lancets 75F MISC Use twice daily to check blood sugar.  DXE11.9   pantoprazole (PROTONIX) 20 MG  tablet Take 1 tablet (20 mg total) by mouth daily.   pioglitazone (ACTOS) 30 MG tablet Take 1 tablet (30 mg total) by mouth daily.   terbinafine (LAMISIL) 250 MG tablet Take 1 tablet (250 mg total) by mouth daily.   vitamin C (ASCORBIC ACID) 500 MG tablet Take 500 mg by mouth 2 (two) times daily.    zaleplon (SONATA) 5 MG capsule Take 1 capsule (5 mg total) by mouth at bedtime as needed for sleep. (Patient not taking: Reported on 12/14/2021)   No facility-administered encounter medications on file as of 12/22/2021.   aspirin 325 MG tablet Last filled:None noted atorvastatin (LIPITOR) 80 MG tablet Last filled:10/10/21 90 DS citalopram (CELEXA) 40 MG tablet Last filled:10/10/21 90 DS fluticasone (FLONASE) 50 MCG/ACT nasal spray Last filled:07/11/21  glipiZIDE (GLUCOTROL XL) 5 MG 24 hr tablet Last filled:10/10/21 90 DS ipratropium (ATROVENT) 0.03 % nasal spray Last filled:12/01/21 84 DS levocetirizine (XYZAL) 5 MG tablet Last filled:10/10/21 90 DS lisinopril (ZESTRIL) 5 MG tablet Last filled:10/10/21 90 DS metFORMIN (GLUCOPHAGE) 1000 MG tablet Last filled:10/10/21 90 DS metoprolol succinate (TOPROL-XL) 50 MG 24 hr tablet Last filled:10/10/21 90 DS Mometasone Furoate (ASMANEX HFA) 100 MCG/ACT AERO Last filled:None noted montelukast (SINGULAIR) 10 MG tablet Last filled:10/11/21 90 DS nitroGLYCERIN (NITROSTAT) 0.4 MG SL tablet Last filled:03/09/21 30 DS pantoprazole (PROTONIX) 20 MG tablet Last filled:12/05/21 90 DS pioglitazone (ACTOS) 30 MG tablet Last filled:10/10/21 90 DS terbinafine (LAMISIL) 250 MG tablet Last filled:12/08/21 90 DS vitamin C (ASCORBIC ACID) 500 MG tablet Last filled:None noted zaleplon (SONATA) 5 MG capsule Last filled:04/01/20 30 DS   Care Gaps: Zoster Vaccines- Shingrix:Never done COVID-19 Vaccine:Overdue since 09/12/2020   Star Rating Drugs: atorvastatin (LIPITOR) 80 MG tablet Last filled:10/10/21 90 DS glipiZIDE (GLUCOTROL XL) 5 MG 24 hr tablet Last filled:10/10/21 90 DS lisinopril (ZESTRIL) 5 MG tablet Last filled:10/10/21 90 DS metFORMIN (GLUCOPHAGE) 1000 MG tablet Last filled:10/10/21 90 DS  Myriam Elta [place], [place]

## 2021-12-25 ENCOUNTER — Telehealth: Payer: Self-pay | Admitting: *Deleted

## 2021-12-25 NOTE — Chronic Care Management (AMB) (Signed)
°  Care Management   Note  12/25/2021 Name: Gary Barnett MRN: [id number] DOB: [birth year]  Gary Barnett is a 71 y.o. year old male who is a primary care patient of Shelda Pal, DO and is actively engaged with the care management team. I reached out to [name] by phone today to assist with re-scheduling an initial visit with the Pharmacist  Follow up plan: Unsuccessful telephone outreach attempt made. A HIPAA compliant phone message was left for the patient providing contact information and requesting a return call.  The care management team will reach out to the patient again over the next 7 days.  If patient returns call to provider office, please advise to call [place]  at [phone].  [place] Management  Direct Dial: [phone]

## 2021-12-26 NOTE — Chronic Care Management (AMB) (Signed)
°  Care Management   Note  12/26/2021 Name: Gary Barnett MRN: [id number] DOB: [birth year]  Gary Barnett is a 71 y.o. year old male who is a primary care patient of Shelda Pal, DO and is actively engaged with the care management team. I reached out to [name] by phone today to assist with re-scheduling an initial visit with the Pharmacist  Follow up plan: Telephone appointment with care management team member scheduled for: 01/19/2022  Julian Hy, [place], [place] Management  Direct Dial: [phone]

## 2021-12-26 NOTE — Chronic Care Management (AMB) (Signed)
°  Care Management   Note  12/26/2021 Name: Tivon Lemoine MRN: [id number] DOB: Oct 15, 1951  Ilija Maxim is a 71 y.o. year old male who is a primary care patient of Shelda Pal, DO and is actively engaged with the care management team. I reached out to [name] by phone today to assist with re-scheduling an initial visit with the Pharmacist  Follow up plan: 2nd Unsuccessful telephone outreach attempt made. A HIPAA compliant phone message was left for the patient providing contact information and requesting a return call.   Julian Hy, [place] Management  Direct Dial: [phone]

## 2021-12-27 ENCOUNTER — Telehealth: Payer: Medicare Other

## 2022-01-02 ENCOUNTER — Other Ambulatory Visit: Payer: Self-pay | Admitting: Family Medicine

## 2022-01-02 ENCOUNTER — Encounter: Payer: Self-pay | Admitting: Family Medicine

## 2022-01-02 ENCOUNTER — Ambulatory Visit (INDEPENDENT_AMBULATORY_CARE_PROVIDER_SITE_OTHER): Payer: Medicare Other | Admitting: Family Medicine

## 2022-01-02 ENCOUNTER — Telehealth: Payer: Self-pay | Admitting: *Deleted

## 2022-01-02 VITALS — BP 118/72 | HR 57 | Temp 97.8°F | Ht 72.0 in | Wt 199.1 lb

## 2022-01-02 DIAGNOSIS — E1165 Type 2 diabetes mellitus with hyperglycemia: Secondary | ICD-10-CM

## 2022-01-02 DIAGNOSIS — B351 Tinea unguium: Secondary | ICD-10-CM | POA: Diagnosis not present

## 2022-01-02 LAB — HEMOGLOBIN A1C: Hgb A1c MFr Bld: 9.3 % — ABNORMAL HIGH (ref 4.6–6.5)

## 2022-01-02 MED ORDER — PIOGLITAZONE HCL 45 MG PO TABS: 45.0000 mg | ORAL_TABLET | Freq: Every day | ORAL | 2 refills | Status: DC

## 2022-01-02 NOTE — Patient Instructions (Addendum)
Give [place] 2-3 business days to get the results of your labs back.  Keep the diet clean and stay active.  Strong work with your weight loss.   Let us know if you need anything.

## 2022-01-02 NOTE — Telephone Encounter (Signed)
Patient 's wife was per [facility] given detailed instructions per Myocardial Perfusion Study Information Sheet for the test on 01/09/22 at 0800.me. Patient notified to arrive 15 minutes early and that it is imperative to arrive on time for appointment to keep from having the test rescheduled.  If you need to cancel or reschedule your appointment, please call the office within 24 hours of your appointment. . Patient verbalized understanding.Indy Kuck, [place] No mychart

## 2022-01-02 NOTE — Progress Notes (Signed)
Subjective:   Chief Complaint  Patient presents with   Follow-up    3 month    Gary Barnett is a 71 y.o. male here for follow-up of diabetes.  Here w spouse.  Gary Barnett has not been checking his sugars, meter broke.  Patient does not require insulin.   Medications include: metformin 1000 mg bid, glipizide 10 mg/d, Actos 30 mg/d Diet is fair.  Exercise: walking No cp or SOB.   Onychomycosis Taking Lamisil over the past 2 d. Reports compliance, no AE's. New nail is growing in that is not thick/yellow. No pain.   Past Medical History:  Diagnosis Date   Anxiety 11/11/2017   Benign prostatic hyperplasia 04/13/2014   Formatting of this note might be different from the original. 10/1 IMO update   CAD (coronary artery disease) 09/30/2015   Change in bowel habits 06/20/2016   Diabetes mellitus due to underlying condition with unspecified complications ([place]) 1/74/9449   Diabetes mellitus without complication ([place])    Encounter for colonoscopy due to history of adenomatous colonic polyps 05/20/2014   Essential hypertension 08/09/2014   GAD (generalized anxiety disorder) 05/04/2020   Hyperlipemia 08/09/2014   Hyperlipidemia    Hypertension    Insomnia 06/03/2019   Major depressive disorder with single episode, in full remission ([place]) 11/11/2017   Malaise 03/20/2018   Primary osteoarthritis involving multiple joints 11/11/2017   Risk for falls 01/23/2016   Screening for malignant neoplasm of prostate 03/20/2018   Seasonal allergies 08/03/2021   Sigmoid diverticulosis 07/08/2014   Type 2 diabetes mellitus with hyperglycemia, without long-term current use of insulin ([place]) 11/21/2015   Vitamin D deficiency 08/09/2014     Related testing: Retinal exam: Don Pneumovax: done  Objective:  BP 118/72    Pulse (!) 57    Temp 97.8 F (36.6 C) (Oral)    Ht 6' (1.829 m)    Wt 199 lb 2 oz (90.3 kg)    SpO2 98%    BMI 27.01 kg/m  General:  Well developed, well nourished, in no apparent distress Skin: Dystrophic  great toenail on L that is yellowed. No erythema of periungual skin.  Lungs:  CTAB, no access msc use Cardio:  RRR, no bruits, no LE edema Psych: Nml affect and mood.   Assessment:   Type 2 diabetes mellitus with hyperglycemia, without long-term current use of insulin ([place]) - Plan: Hemoglobin A1c   Plan:   Chronic, uncontrolled. Ck A1c. Cont Metformin 1000 mg bid, Actos 30 mg/d, Glipizide XL 10 mg/d. Counseled on diet and exercise. Chronic, stable. Cont Lamisil 250 mg/d.  F/u in 3 mo. The patient and his spouse voiced understanding and agreement to the plan.  [place], DO 01/02/22 12:53 PM

## 2022-01-05 ENCOUNTER — Telehealth: Payer: Self-pay | Admitting: Family Medicine

## 2022-01-05 MED ORDER — ACCU-CHEK FASTCLIX LANCETS MISC: 3 refills | Status: DC

## 2022-01-05 MED ORDER — ACCU-CHEK AVIVA PLUS VI STRP: ORAL_STRIP | 3 refills | Status: DC

## 2022-01-05 NOTE — Telephone Encounter (Signed)
Gary Barnett stated she needed to talk with Shirlean Mylar in detail regarding diabetes meter. She stated there has been some confusion. Please advise.  ?

## 2022-01-05 NOTE — Telephone Encounter (Signed)
Called and sent in as patient requested ?

## 2022-01-09 ENCOUNTER — Ambulatory Visit (INDEPENDENT_AMBULATORY_CARE_PROVIDER_SITE_OTHER): Payer: Medicare Other

## 2022-01-09 ENCOUNTER — Other Ambulatory Visit: Payer: Self-pay

## 2022-01-09 DIAGNOSIS — I2510 Atherosclerotic heart disease of native coronary artery without angina pectoris: Secondary | ICD-10-CM

## 2022-01-09 LAB — ECHOCARDIOGRAM COMPLETE
Area-P 1/2: 2.55 cm2
Calc EF: 52.4 %
Height: 72 in
S' Lateral: 3.6 cm
Single Plane A2C EF: 51.4 %
Single Plane A4C EF: 54.8 %
Weight: 3184 oz

## 2022-01-09 MED ORDER — TECHNETIUM TC 99M TETROFOSMIN IV KIT
10.2000 | PACK | Freq: Once | INTRAVENOUS | Status: AC | PRN
  Administered 2022-01-09: 10.2 via INTRAVENOUS

## 2022-01-09 MED ORDER — REGADENOSON 0.4 MG/5ML IV SOLN
0.4000 mg | Freq: Once | INTRAVENOUS | Status: AC
  Administered 2022-01-09: 0.4 mg via INTRAVENOUS

## 2022-01-09 MED ORDER — TECHNETIUM TC 99M TETROFOSMIN IV KIT
31.7000 | PACK | Freq: Once | INTRAVENOUS | Status: AC | PRN
  Administered 2022-01-09: 31.7 via INTRAVENOUS

## 2022-01-16 ENCOUNTER — Telehealth: Payer: Self-pay

## 2022-01-16 LAB — MYOCARDIAL PERFUSION IMAGING
LV dias vol: 100 mL (ref 62–150)
LV sys vol: 48 mL
Nuc Stress EF: 52 %
Peak HR: 81 {beats}/min
Rest HR: 58 {beats}/min
Rest Nuclear Isotope Dose: 10.2 mCi
SDS: 6
SRS: 6
SSS: 11
Stress Nuclear Isotope Dose: 31.7 mCi
TID: 1.01

## 2022-01-16 NOTE — Telephone Encounter (Signed)
Left VM for pt to call back.

## 2022-01-16 NOTE — Telephone Encounter (Signed)
-----   Message from Jenean Lindau, MD sent at 01/16/2022  3:49 PM EDT ----- ?There is no evidence of ischemia but in view of significant abnormality on the stress test I would like to do a CT coronary angiography with FFR. ?Jenean Lindau, MD 01/16/2022 3:48 PM  ?

## 2022-01-19 ENCOUNTER — Ambulatory Visit (INDEPENDENT_AMBULATORY_CARE_PROVIDER_SITE_OTHER): Payer: Medicare Other | Admitting: Pharmacist

## 2022-01-19 DIAGNOSIS — I10 Essential (primary) hypertension: Secondary | ICD-10-CM

## 2022-01-19 DIAGNOSIS — E1165 Type 2 diabetes mellitus with hyperglycemia: Secondary | ICD-10-CM

## 2022-01-19 DIAGNOSIS — J302 Other seasonal allergic rhinitis: Secondary | ICD-10-CM

## 2022-01-19 DIAGNOSIS — E782 Mixed hyperlipidemia: Secondary | ICD-10-CM

## 2022-01-19 MED ORDER — PIOGLITAZONE HCL 45 MG PO TABS: 45.0000 mg | ORAL_TABLET | Freq: Every day | ORAL | 0 refills | Status: DC

## 2022-01-19 MED ORDER — MONTELUKAST SODIUM 10 MG PO TABS: 10.0000 mg | ORAL_TABLET | Freq: Every day | ORAL | 1 refills | Status: DC

## 2022-01-19 NOTE — Chronic Care Management (AMB) (Signed)
? ? ?Chronic Care Management ?Pharmacy Note ? ?01/19/2022 ?Name:  Knut Rondinelli MRN:  [id number] DOB:  06/22/1951 ? ?Summary: ?A1c not at goal - at 9.8%. Pioglitazone was increase from 20m to 45 mg daily. Also taking glipizide ER 174mdaily and metformin 100059mwice a day. Patient expressed that he will run out of glucose testing supplies soon. He likes the Accu- Chek meter but it is not preferred by [employer] (contour and One Touch are). Has tried One Touch and did not like it, states that test strips were bending and cheap.  ?Reviewed medication list and adherence ? ?Recommendations/Changes made from today's visit: ?Continue current therapy for diabetes. Monitor for swelling with pioglitazone. Could consider long acting insulin or GLP1 next though cost of GLP1 might be prohibitive.  ?Assessed for LIS / Extra help with Medicare and patient is about $500 above limit for partial help.  ?Contacted [employer] for prior authorization for [employer] Incd supplies. - pending.  ? ? ?Subjective: ?DenYonas Bunda an 70 46o. year old male who is a primary patient of WenShelda PalO.  The CCM team was consulted for assistance with disease management and care coordination needs.   ? ?Engaged with patient by telephone for initial visit in response to provider referral for pharmacy case management and/or care coordination services.  ? ?Consent to Services:  ?The patient was given the following information about Chronic Care Management services today, agreed to services, and gave verbal consent: 1. CCM service includes personalized support from designated clinical staff supervised by the primary care provider, including individualized plan of care and coordination with other care providers 2. 24/7 contact phone numbers for assistance for urgent and routine care needs. 3. Service will only be billed when office clinical staff spend 20 minutes or more in a month to coordinate care. 4. Only one practitioner may furnish  and bill the service in a calendar month. 5.The patient may stop CCM services at any time (effective at the end of the month) by phone call to the office staff. 6. The patient will be responsible for cost sharing (co-pay) of up to 20% of the service fee (after annual deductible is met). Patient agreed to services and consent obtained. ? ?Patient Care Team: ?WenShelda PalO as PCP - General (Family Medicine) ?EckCherre RobinsPH-CPP (Pharmacist) ? ?Recent office visits: ?01/02/2022 - PCP (Dr WenNani Ravenseen for type 2 DM. Not checking BG, meter broken.  ?11/28/21-Nicholas PauNolene EbbsO (PCP) [facility] edema. [facility]Nicholas PauNolene EbbsO (PCP) Seen for a nail problem. Labs ordered. Start on terbinafine (LAMISIL) 250 MG tablet for onychomycosis.  ?10/02/21-Nicholas PauNolene EbbsO (PCP) Seen for a 6 month follow up visit. Labs ordered. Given Pneumococcal conjugate vaccine 20-valent (Prevnar 20). Follow up in 6 months.  ?08/25/21-Nicholas PauNolene EbbsO (PCP, Video visit) Seen for COVID-19 issues. Start on Mometasone Furoate ([facility]) 100 MCG/ACT AERO, benzonatate (TESSALON) 200 MG capsule. ?08/03/21-Nicholas PauNolene EbbsO (PCP, Video visit) Seen for allergies. Start on benzonatate (TESSALON) 200 MG capsule and montelukast (SINGULAIR) 10 MG tablet. ?07/29/21-(Urgent care) Brain DevCarney Cornerseen for suspected COVID-19 infection.  ?  ?Recent consult visits:  ?12/14/21-(Cardiology) RajReita Clicheevankar, MD. General follow up visit. Follow up in 6 months.  ?12/01/21-(Allergy Specialist) EriSigurd SosD. Seen for chronic rhinitis. Start Atrovent (Ipratropium Bromide) 1-2 sprays in each nostril up to 3 times a day as needed for runny nose/post nasal drip/drainage. ?10/17/21-(Urology) PhiFerdie PingD.  Annual check up.  ?07/27/21-(General surgery) Tommie Sams, MD. ?  ?Hospital visits:  ?None in previous 6 months ? ?Objective: ? ?Lab  Results  ?Component Value Date  ? CREATININE 0.81 10/02/2021  ? CREATININE 0.81 03/08/2021  ? CREATININE 0.86 08/09/2020  ? ? ?Lab Results  ?Component Value Date  ? HGBA1C 9.3 (H) 01/02/2022  ? ?Last diabetic Eye exam: No results found for: HMDIABEYEEXA  ?Last diabetic Foot exam: No results found for: HMDIABFOOTEX  ? ?   ?Component Value Date/Time  ? CHOL 109 10/02/2021 1359  ? CHOL 149 02/16/2020 1139  ? TRIG 116.0 10/02/2021 1359  ? HDL 34.30 (L) 10/02/2021 1359  ? HDL 30 (L) 02/16/2020 1139  ? CHOLHDL 3 10/02/2021 1359  ? VLDL 23.2 10/02/2021 1359  ? LDLCALC 51 10/02/2021 1359  ? [place] 40 08/09/2020 1359  ? ? ?Hepatic Function Latest Ref Rng & Units 11/28/2021 11/14/2021 10/02/2021  ?Total Protein 6.0 - 8.3 g/dL 6.4 6.6 6.6  ?Albumin 3.5 - 5.2 g/dL 4.3 4.4 4.4  ?AST 0 - 37 U/L _0 ?ALT 0 - 53 U/L _1 ?Alk Phosphatase 39 - 117 U/L 110 124(H) 93  ?Total Bilirubin 0.2 - 1.2 mg/dL 0.6 1.1 1.1  ?Bilirubin, Direct 0.0 - 0.3 mg/dL 0.1 0.2 -  ? ? ?No results found for: TSH, FREET4 ? ?CBC Latest Ref Rng & Units 10/02/2021 08/09/2020 06/03/2019  ?WBC 4.0 - 10.5 K/uL 6.6 6.6 7.3  ?Hemoglobin 13.0 - 17.0 g/dL 13.8 13.4 14.7  ?Hematocrit 39.0 - 52.0 % 42.1 39.2 43.4  ?Platelets 150.0 - 400.0 K/uL 204.0 216 214.0  ? ? ?No results found for: VD25OH ? ?Clinical ASCVD: Yes  ?The ASCVD Risk score (Arnett DK, et al., 2019) failed to calculate for the following reasons: ?  The valid total cholesterol range is 130 to 320 mg/dL   ? ? ?Social History  ? ?Tobacco Use  ?Smoking Status Former  ? Packs/day: 1.00  ? Years: 25.00  ? Pack years: 25.00  ? Types: Cigarettes  ? Quit date: 1  ? Years since quitting: 29.2  ?Smokeless Tobacco Never  ? ?BP Readings from Last 3 Encounters:  ?01/02/22 118/72  ?12/14/21 124/64  ?12/01/21 130/74  ? ?Pulse Readings from Last 3 Encounters:  ?01/02/22 (!) 57  ?12/14/21 (!) 58  ?12/01/21 64  ? ?Wt Readings from Last 3 Encounters:  ?01/09/22 199 lb (90.3 kg)  ?01/02/22 199 lb 2 oz (90.3 kg)   ?12/14/21 204 lb 1.3 oz (92.6 kg)  ? ? ?Assessment: Review of patient past medical history, allergies, medications, health status, including review of consultants reports, laboratory and other test data, was performed as part of comprehensive evaluation and provision of chronic care management services.  ? ?SDOH:  (Social Determinants of Health) assessments and interventions performed:  ? ? ?CCM Care Plan ? ?Allergies  ?Allergen Reactions  ? Clopidogrel Hives and Itching  ? ? ?Medications Reviewed Today   ? ? Reviewed by Shelda Pal, DO (Physician) on 01/02/22 at 1253  Med List Status: <None>  ? ?Medication Order Taking? Sig Documenting Provider Last Dose Status Informant  ?aspirin 325 MG tablet [id number] Yes Take 325 mg by mouth daily. [provider] Taking Active   ?atorvastatin (LIPITOR) 80 MG tablet [id number] Yes Take 1 tablet (80 mg total) by mouth daily. Shelda Pal, DO Taking Active   ?Blood Glucose Monitoring Suppl (ONETOUCH VERIO) w/Device KIT [id number] Yes Use twice daily to check blood sugar.  DX E11.9 Shelda Pal, DO Taking Active   ?citalopram (CELEXA) 40 MG tablet [id number] Yes Take 1 tablet (40 mg total) by mouth daily. Shelda Pal, DO Taking Active   ?ELDERBERRY PO [id number] Yes Take 1 capsule by mouth daily. [provider] Taking Active   ?fluticasone (FLONASE) 50 MCG/ACT nasal spray [id number] Yes Place 2 sprays into both nostrils daily. Shelda Pal, DO Taking Active   ?glipiZIDE (GLUCOTROL XL) 5 MG 24 hr tablet [id number] Yes TAKE 2 TABLETS BY MOUTH  DAILY WITH BREAKFAST Shelda Pal, DO Taking Active   ?glucose blood [facility] VERIO) test strip [id number] Yes USE TO CHECK BLOOD SUGAR TWICE DAILY Shelda Pal, DO Taking Active   ?ipratropium (ATROVENT) 0.03 % nasal spray [id number] Yes Place 2 sprays into both nostrils 3 (three) times daily as needed for rhinitis. Sigurd Sos, MD Taking Active    ?levocetirizine (XYZAL) 5 MG tablet [id number] Yes Take 1 tablet (5 mg total) by mouth every evening. Shelda Pal, DO Taking Active   ?lisinopril (ZESTRIL) 5 MG tablet [id number] Yes Take 1 tablet (5

## 2022-01-22 ENCOUNTER — Telehealth: Payer: Self-pay | Admitting: Pharmacist

## 2022-01-22 NOTE — Patient Instructions (Signed)
Gary Barnett,  ?It was a pleasure speaking with you today.  ?I have attached a summary of our visit today and information about your health goals. (See Below)  ? ? ?If you have any questions or concerns, please feel free to contact me either at the phone number below or with a MyChart message.  ? ?Keep up the good work! ? ?Cherre Robins, PharmD ?Clinical Pharmacist ?[facility] ?[place] ?[phone] (direct line)  ?[phone] (main office number) ? ? ?Chronic Care Management Care Plan ? ?Diabetes: ?Uncontrolled; A1c goal Less than 7.0% ?Lab Results  ?Component Value Date  ? HGBA1C 9.3 (H) 01/02/2022  ? ?Current treatment: ?Pioglitazone '45mg'$  - take 1 tablet daily (recently increased from '30mg'$  daily) ?Glipizide ER '5mg'$  - Take 2 tablets = '10mg'$  daily  ? Metformin '1000mg'$  - take 1 tablet twice a day ?Intervention:  ?Reviewed home blood glucose readings and reviewed goals  ?Fasting blood glucose goal (before meals) = 80 to 130 ?Blood glucose goal after a meal = less than 180  ?Educated on A1c goal ?Recommended continue current therapy. Reminded to monitor for increased swelling with increase in pioglitazone dose  ?Collaborated with [employer] for prior authorization for Accu-Check supplies - lancets and test strips. If denied, will try to get patient and wife to try Contour glucometer.  ? ?Hypertension: ?Controlled; blood pressure goal < 130/80 ?BP Readings from Last 3 Encounters:  ?01/02/22 118/72  ?12/14/21 124/64  ?12/01/21 130/74  ? ?current treatment: ?Metoprolol ER '50mg'$  daily ?Lisinopril '5mg'$  daily  ?Interventions:  ?Educated on importance in blood pressure control to prevent stroke and kidney disease ?Recommended continue current therapy ? ?Hyperlipidemia / CAD: ?Controlled; Goal LDL < 70, triglycerides < 150 ?Lab Results  ?Component Value Date  ? CHOL 109 10/02/2021  ? HDL 34.30 (L) 10/02/2021  ? [place] 51 10/02/2021  ? TRIG 116.0 10/02/2021  ? CHOLHDL 3 10/02/2021  ? ?Current  treatment: ?Atorvastatin '80mg'$  daily  ?Intervention:  ?Educated on lipid goals ?Recommended Continue current therapy ? ?Medication management ?Pharmacist Clinical Goal(s): ?Over the next 90 days, patient will work with PharmD and providers to maintain optimal medication adherence ?Current pharmacy: [facility] ?Interventions ?Comprehensive medication review performed. ?Reviewed refill history and assessed adherence ?Continue current medication management strategy ?Coordinated with mail order pharmacy to refill needed medications - atorvastatin, citalopram, glipizide, levocitirizine, lisinopril, metformin, metoprolol succinate, montelukast,  ? ?Patient Goals/Self-Care Activities ?Over the next 90 days, patient will:  ?take medications as prescribed ?focus on medication adherence by using weekly pill reminder and filling prescription on time ?check glucose daily document, and provide at future appointments. We have submitted prior authorization for Accu-Chek supplies to [employer] and [employer]. Awaiting decision.  ?collaborate with provider on medication access solutions - needed prescriptions have been requested from [employer].  ? ?Follow Up Plan: Telephone follow up appointment with care management team member scheduled for:  2 to 4 weeks ? ?The patient verbalized understanding of instructions, educational materials, and care plan provided today and agreed to receive a mailed copy of patient instructions, educational materials, and care plan.   ?

## 2022-01-22 NOTE — Telephone Encounter (Signed)
Received call from [facility] today that prior authorization approved to get [employer] testing supplies.  ?LM on patients VM and his wife's VM about prior authorization.  ?

## 2022-01-24 ENCOUNTER — Other Ambulatory Visit: Payer: Self-pay

## 2022-01-24 MED ORDER — ACCU-CHEK FASTCLIX LANCETS MISC: 3 refills | Status: DC

## 2022-01-24 MED ORDER — LISINOPRIL 5 MG PO TABS: 5.0000 mg | ORAL_TABLET | Freq: Every day | ORAL | 2 refills | Status: DC

## 2022-01-30 ENCOUNTER — Ambulatory Visit (INDEPENDENT_AMBULATORY_CARE_PROVIDER_SITE_OTHER): Payer: Medicare Other | Admitting: Family Medicine

## 2022-01-30 ENCOUNTER — Encounter: Payer: Self-pay | Admitting: Family Medicine

## 2022-01-30 VITALS — BP 118/60 | HR 68 | Temp 97.5°F | Ht 72.0 in | Wt 201.2 lb

## 2022-01-30 DIAGNOSIS — E1165 Type 2 diabetes mellitus with hyperglycemia: Secondary | ICD-10-CM

## 2022-01-30 NOTE — Patient Instructions (Signed)
Keep the diet clean and stay active. ? ?Continue to monitor your sugars.  ? ?Let us know if you need anything. ?

## 2022-01-30 NOTE — Progress Notes (Signed)
Chief Complaint  ?Patient presents with  ? Follow-up  ?  1 week ?  ? ? ?Subjective: ?Patient is a 71 y.o. male here for f/u DM. Here w spouse.  ? ?Taking Metformin 1000 mg bid, Glipizide 10 mg/d, Actos 45 mg/d (recently increased). Reports compliance, no AE's. Most recent A1c was 9.3. Diet is improving. Walking for exercise. No hypoglycemia. Sugars running in the AM fasting in the 60-90's. Mid 100's in the evening.  ? ?Past Medical History:  ?Diagnosis Date  ? Anxiety 11/11/2017  ? Benign prostatic hyperplasia 04/13/2014  ? Formatting of this note might be different from the original. 10/1 IMO update  ? CAD (coronary artery disease) 09/30/2015  ? Change in bowel habits 06/20/2016  ? Diabetes mellitus due to underlying condition with unspecified complications ([place]) 4/69/6295  ? Diabetes mellitus without complication ([place])   ? Encounter for colonoscopy due to history of adenomatous colonic polyps 05/20/2014  ? Essential hypertension 08/09/2014  ? GAD (generalized anxiety disorder) 05/04/2020  ? Hyperlipemia 08/09/2014  ? Hyperlipidemia   ? Hypertension   ? Insomnia 06/03/2019  ? Major depressive disorder with single episode, in full remission ([place]) 11/11/2017  ? Malaise 03/20/2018  ? Primary osteoarthritis involving multiple joints 11/11/2017  ? Risk for falls 01/23/2016  ? Screening for malignant neoplasm of prostate 03/20/2018  ? Seasonal allergies 08/03/2021  ? Sigmoid diverticulosis 07/08/2014  ? Type 2 diabetes mellitus with hyperglycemia, without long-term current use of insulin ([place]) 11/21/2015  ? Vitamin D deficiency 08/09/2014  ? ? ?Objective: ?BP 118/60   Pulse 68   Temp (!) 97.5 ?F (36.4 ?C) (Oral)   Ht 6' (1.829 m)   Wt 201 lb 4 oz (91.3 kg)   SpO2 99%   BMI 27.29 kg/m?  ?General: Awake, appears stated age ?Heart: RRR, no LE edema ?Lungs: CTAB, no rales, wheezes or rhonchi. No accessory muscle use ?Psych: normal affect and mood ? ?Assessment and Plan: ?Type 2 diabetes mellitus with hyperglycemia, without long-term current  use of insulin ([place]) - Plan: Basic metabolic panel ? ?Chronic, not controlled. Offered Ozempic. He politely declined and preferred to be on a stricter diet which he has started implementing. Based on home readings, does not sound like his pancreas is failing, so no need for insulin at this time. Will reck in 2 mo. He will call if he changes his mind about the Ozempic. Stay on Glipizide 10 mg/d, Metformin 1000 mg bid, Actos 45 mg/d. Cont to monitor sugars.  ?The patient and his wife voiced understanding and agreement to the plan. ? ?Shelda Pal, DO ?01/30/22  ?3:32 PM ? ? ? ? ?

## 2022-01-31 LAB — BASIC METABOLIC PANEL
BUN: 16 mg/dL (ref 6–23)
CO2: 28 mEq/L (ref 19–32)
Calcium: 9.2 mg/dL (ref 8.4–10.5)
Chloride: 101 mEq/L (ref 96–112)
Creatinine, Ser: 0.96 mg/dL (ref 0.40–1.50)
GFR: 81.24 mL/min (ref >60.00)
Glucose, Bld: 158 mg/dL — ABNORMAL HIGH (ref 70–99)
Potassium: 4.4 mEq/L (ref 3.5–5.1)
Sodium: 138 mEq/L (ref 135–145)

## 2022-02-02 DIAGNOSIS — E1165 Type 2 diabetes mellitus with hyperglycemia: Secondary | ICD-10-CM

## 2022-02-02 DIAGNOSIS — E782 Mixed hyperlipidemia: Secondary | ICD-10-CM

## 2022-02-02 DIAGNOSIS — I10 Essential (primary) hypertension: Secondary | ICD-10-CM

## 2022-02-05 ENCOUNTER — Telehealth: Payer: Medicare Other

## 2022-02-05 ENCOUNTER — Telehealth: Payer: Self-pay | Admitting: Pharmacist

## 2022-02-05 NOTE — Telephone Encounter (Signed)
?  Care Management  ? ?Follow Up Note ? ? ?02/05/2022 ?Name: Gary Barnett MRN: [id number] DOB: [birth year] ? ? ?Referred by: Shelda Pal, DO ?Reason for referral : Appointment ? ? ?An unsuccessful telephone outreach was attempted today. The patient was referred to the case management team for assistance with care management and care coordination.  ? ?Follow Up Plan: The care management team will reach out to the patient again over the next 45 days.  ? ?Cherre Robins, PharmD ?Clinical Pharmacist ?[facility] ?[place] ? ?

## 2022-02-08 NOTE — Chronic Care Management (AMB) (Signed)
?  Care Management  ? ?Note ? ?02/08/2022 ?Name: Gary Barnett MRN: [id number] DOB: 1950-12-02 ? ?Gary Barnett is a 70 y.o. year old male who is a primary care patient of Nani Ravens Crosby Oyster, DO and is actively engaged with the care management team. I reached out to [name] by phone today to assist with re-scheduling a follow up visit with the Pharmacist ? ?Follow up plan: ?Unsuccessful telephone outreach attempt made. A HIPAA compliant phone message was left for the patient providing contact information and requesting a return call.  ? ?Jaimi Belle, CCMA ?Care Guide, Embedded Care Coordination ?[place]  Care Management  ?Direct Dial: [phone] ? ? ?

## 2022-02-12 NOTE — Chronic Care Management (AMB) (Signed)
?  Care Management  ? ?Note ? ?02/12/2022 ?Name: Gary Barnett MRN: [id number] DOB: 09-02-1951 ? ?Gary Barnett is a 70 y.o. year old male who is a primary care patient of Nani Ravens Crosby Oyster, DO and is actively engaged with the care management team. I reached out to [name] by phone today to assist with re-scheduling a follow up visit with the Pharmacist ? ?Follow up plan: ?2nd Unsuccessful telephone outreach attempt made. A HIPAA compliant phone message was left for the patient providing contact information and requesting a return call.  ? ?Wafa Martes, CCMA ?Care Guide, Embedded Care Coordination ?[place]  Care Management  ?Direct Dial: [phone] ? ? ?

## 2022-02-15 ENCOUNTER — Telehealth: Payer: Self-pay

## 2022-02-15 NOTE — Telephone Encounter (Signed)
Attempted to call to schedule medicare wellness visit. LVM ?

## 2022-02-20 NOTE — Chronic Care Management (AMB) (Signed)
?  Care Management  ? ?Note ? ?02/20/2022 ?Name: [name] MRN: [id number] DOB: 25-Mar-1951 ? ?Gary Barnett is a 71 y.o. year old male who is a primary care patient of Nani Ravens Crosby Oyster, DO and is actively engaged with the care management team. I reached out to [name] by phone today to assist with re-scheduling a follow up visit with the Pharmacist ? ?Follow up plan: ?Telephone appointment with care management team member scheduled for: 03/16/2022 ? ?Caitlynne Harbeck, CCMA ?Care Guide, Embedded Care Coordination ?[place]  Care Management  ?Direct Dial: [phone] ? ? ?

## 2022-03-08 ENCOUNTER — Telehealth: Payer: Self-pay | Admitting: *Deleted

## 2022-03-08 NOTE — Chronic Care Management (AMB) (Signed)
  Chronic Care Management Note  03/08/2022 Name: Gary Barnett MRN: [id number] DOB: [birth year]  Gary Barnett is a 71 y.o. year old male who is a primary care patient of Shelda Pal, DO and is actively engaged with the care management team. I reached out to [name] by phone today to assist with re-scheduling a follow up visit with the Pharmacist  Follow up plan: Unsuccessful telephone outreach attempt made. A HIPAA compliant phone message was left for the patient providing contact information and requesting a return call.   Julian Hy, [place] Management  Direct Dial: [phone]

## 2022-03-09 ENCOUNTER — Telehealth: Payer: Self-pay | Admitting: Family Medicine

## 2022-03-09 NOTE — Telephone Encounter (Signed)
Left message for patient to call back and schedule Medicare Annual Wellness Visit (AWV) in office.  ? ?If not able to come in office, please offer to do virtually or by telephone.  Left office number and my jabber [phone]. ? ?AWVI eligible as of  08/05/2012 ? ?Please schedule at anytime with Nurse Health Advisor. ?  ?

## 2022-03-16 ENCOUNTER — Telehealth: Payer: Medicare Other

## 2022-03-23 ENCOUNTER — Ambulatory Visit (INDEPENDENT_AMBULATORY_CARE_PROVIDER_SITE_OTHER): Payer: Medicare Other | Admitting: Pharmacist

## 2022-03-23 ENCOUNTER — Telehealth (HOSPITAL_COMMUNITY): Payer: Self-pay | Admitting: *Deleted

## 2022-03-23 DIAGNOSIS — E782 Mixed hyperlipidemia: Secondary | ICD-10-CM

## 2022-03-23 DIAGNOSIS — I10 Essential (primary) hypertension: Secondary | ICD-10-CM

## 2022-03-23 DIAGNOSIS — E1165 Type 2 diabetes mellitus with hyperglycemia: Secondary | ICD-10-CM

## 2022-03-23 NOTE — Patient Instructions (Addendum)
Mr. Krist It was a pleasure speaking with you  Below is a summary of your health goals and care plan   Patient Goals/Self-Care Activities take medications as prescribed focus on medication adherence by using weekly pill reminder and filling prescription on time check glucose daily document, and provide at future appointments.   collaborate with provider on medication access solutions and changes in medications How to Treat a Low Glucose Level:  If you have a low blood glucose less than 70, please eat / drink 15 grams of carbohydrates (4 oz of juice, soda, 4 glucose tablets, or 3-4 pieces of hard candy).  It is best so choose a "quick" source of sugar that does no contain fat (chocolate and peanut butter might take longer to increase your blood glucose) Wait 15 minutes and then recheck your blood glucose. If your blood glucose is still less than 70, eat another 15 grams of carbohydrates.  Wait another 15 minutes and recheck your glucose.  Continue this until your blood glucose is over 70. Once you blood glucose is over 70, eat a snack with protein in it to prevent your blood glucose from dropping again.   If you have any questions or concerns, please feel free to contact me either at the phone number below or with a MyChart message.   Keep up the good work!  Cherre Robins, PharmD Clinical Pharmacist [facility] [facility] [place] [phone] (direct line)  [phone] (main office number)   The patient verbalized understanding of instructions, educational materials, and care plan provided today and agreed to receive a mailed copy of patient instructions, educational materials, and care plan.

## 2022-03-23 NOTE — Telephone Encounter (Signed)
Returning message to patient's wife concerning the patient's upcoming CCTA appointment. Patient is aware to take his metoprolol succinate two hours prior to test and to arrive at 11am.  They were told to hold is allergy medications and lisinopril for the day. Patient's wife would appreciate another call closer to appointment in case they have any other questions.  Gordy Clement RN Navigator Cardiac Imaging [facility] [phone] Office [phone] Cell

## 2022-03-23 NOTE — Chronic Care Management (AMB) (Unsigned)
Chronic Care Management Pharmacy Note  03/23/2022 Name:  Gary Barnett MRN:  [id number] DOB:  Sep 30, 1951  Summary: Originally was suppose to have follow up with patient 1 months ago but he missed phone appointment and did not return calls for follow up. His wife called 03/22/22 with questions about prior authorization for testing supplies. Patient filled Accu-Check test strips 01/24/2022 for #300 - check blood glucose 3 times a day and cost was $86.23. (cost without insurance would have been around $135). Accu-Check is not preferred on [employer] and I got prior authorization approved 01/19/2022 thru 01/20/2023. Called [facility] to check cost. Pharmacist reported that was run on his Part D benefits but per prior authorization the Part B department approved so might need to be run on Part B benefits. Pharmacist with [facility] checked and they are not approved to bill thru Part B. He will have to use local pharmacy for testing supplies to be billed through Part B.   A1c not at goal - at 9.8%. Pioglitazone was increase from 69m to 45 mg daily after last A1c but today patient reports that he stopped pioglitazone due to having low blood glucose of 50 for 2 nights. Required wife to assist with getting snack to treat hypoglycemia. He has not taken pioglitazone for about the last 5 to 7 days. He is also taking glipizide ER 511m- 2 tablets = 1091maily and metformin 1000m89mice a day. I suspect that lows are actually related to glipizide and not pioglitazone. Will consult with PCP but would like patient to restart pioglitazone and maybe take only glipizide ER 5mg 22m tablet daily. He sees Dr WendlNani Ravens0/2023.  Reviewed s/s and treatment of hypoglycemia.   Recommendations/Changes made from today's visit: Recommended restart pioglitazone and will check with Dr WendlNani Ravenst glipizide dose or other options.  Could consider repaglinide 1mg w99m am and pm meals - repaglinide has quick onset of  30 minutes and peach response of 60 to 90 minutes; half life 60 minutes Compared to glipizide which has onset of 30 minutes, peak response of 2 to 3 hours and half life of 2 to 5 hours.  Also depending on A1c long acting insulin or GLP1 would having lower hypoglycemic risk. If cost if concern would e glad to assist with applying for medication assistance program.   Subjective: Gary Barnett 70 y.o54year old male who is a primary patient of WendliShelda Barnett The CCM team was consulted for assistance with disease management and care coordination needs.    Engaged with patient by telephone for initial visit in response to provider referral for pharmacy case management and/or care coordination services.   Consent to Services:  The patient was given the following information about Chronic Care Management services today, agreed to services, and gave verbal consent: 1. CCM service includes personalized support from designated clinical staff supervised by the primary care provider, including individualized plan of care and coordination with other care providers 2. 24/7 contact phone numbers for assistance for urgent and routine care needs. 3. Service will only be billed when office clinical staff spend 20 minutes or more in a month to coordinate care. 4. Only one practitioner may furnish and bill the service in a calendar month. 5.The patient may stop CCM services at any time (effective at the end of the month) by phone call to the office staff. 6. The patient will be responsible for cost sharing (co-pay) of  up to 20% of the service fee (after annual deductible is met). Patient agreed to services and consent obtained.  Patient Care Team: Gary Pal, DO as PCP - General (Family Medicine) Cherre Robins, RPH-CPP (Pharmacist)  Recent office visits: 01/30/2022 - PCP (Dr Nani Ravens) F/U type 2 DM. Home BG improving. Offered Ozempic but patient declined. BMP checked - BG was  158. 01/02/2022 - PCP (Dr Nani Ravens) Seen for type 2 DM. Not checking BG, meter broken.  11/28/21-Nicholas Nolene Ebbs, DO (PCP) Seen for edema. AMB Referral to [facility]. 11/14/21-Nicholas Nolene Ebbs, DO (PCP) Seen for a nail problem. Labs ordered. Start on terbinafine (LAMISIL) 250 MG tablet for onychomycosis.  10/02/21-Nicholas Nolene Ebbs, DO (PCP) Seen for a 6 month follow up visit. Labs ordered. Given Pneumococcal conjugate vaccine 20-valent (Prevnar 20). Follow up in 6 months.  08/25/21-Nicholas Nolene Ebbs, DO (PCP, Video visit) Seen for COVID-19 issues. Start on Mometasone Furoate [facility] HFA) 100 MCG/ACT AERO, benzonatate (TESSALON) 200 MG capsule. 08/03/21-Nicholas Nolene Ebbs, DO (PCP, Video visit) Seen for allergies. Start on benzonatate (TESSALON) 200 MG capsule and montelukast (SINGULAIR) 10 MG tablet. 07/29/21-(Urgent care) [facility] [place]. Seen for suspected COVID-19 infection.    Recent consult visits:  12/14/21-(Cardiology) Reita Cliche. Revankar, MD. General follow up visit. Follow up in 6 months.  12/01/21-(Allergy Specialist) Sigurd Sos, MD. Seen for chronic rhinitis. Start Atrovent (Ipratropium Bromide) 1-2 sprays in each nostril up to 3 times a day as needed for runny nose/post nasal drip/drainage. 10/17/21-(Urology) Ferdie Ping, MD. Annual check up.  07/27/21-(General surgery) Tommie Sams, [place] [facility] visits:  None in previous 6 months  Objective:  Lab Results  Component Value Date   CREATININE 0.96 01/30/2022   CREATININE 0.81 10/02/2021   CREATININE 0.81 03/08/2021    Lab Results  Component Value Date   HGBA1C 9.3 (H) 01/02/2022   Last diabetic Eye exam: No results found for: HMDIABEYEEXA  Last diabetic Foot exam: No results found for: HMDIABFOOTEX      Component Value Date/Time   CHOL 109 10/02/2021 1359   CHOL 149 02/16/2020 1139   TRIG 116.0 10/02/2021 1359   HDL 34.30 (L) 10/02/2021 1359   HDL 30 (L)  02/16/2020 1139   CHOLHDL 3 10/02/2021 1359   VLDL 23.2 10/02/2021 1359   LDLCALC 51 10/02/2021 1359   LDLCALC 40 08/09/2020 1359       Latest Ref Rng & Units 11/28/2021   11:54 AM 11/14/2021    1:56 PM 10/02/2021    1:59 PM  Hepatic Function  Total Protein 6.0 - 8.3 g/dL 6.4   6.6   6.6    Albumin 3.5 - 5.2 g/dL 4.3   4.4   4.4    AST 0 - 37 U/L _0 ALT 0 - 53 U/L _1 Alk Phosphatase 39 - 117 U/L 110   124   93    Total Bilirubin 0.2 - 1.2 mg/dL 0.6   1.1   1.1    Bilirubin, Direct 0.0 - 0.3 mg/dL 0.1   0.2       No results found for: TSH, FREET4     Latest Ref Rng & Units 10/02/2021    1:59 PM 08/09/2020    1:59 PM 06/03/2019    1:30 PM  CBC  WBC 4.0 - 10.5 K/uL 6.6   6.6   7.3    Hemoglobin 13.0 -  17.0 g/dL 13.8   13.4   14.7    Hematocrit 39.0 - 52.0 % 42.1   39.2   43.4    Platelets 150.0 - 400.0 K/uL 204.0   216   214.0      No results found for: VD25OH  Clinical ASCVD: Yes  The ASCVD Risk score (Arnett DK, et al., 2019) failed to calculate for the following reasons:   The valid total cholesterol range is 130 to 320 mg/dL     Social History   Tobacco Use  Smoking Status Former   Packs/day: 1.00   Years: 25.00   Pack years: 25.00   Types: Cigarettes   Quit date: 70   Years since quitting: 29.3  Smokeless Tobacco Never   BP Readings from Last 3 Encounters:  01/30/22 118/60  01/02/22 118/72  12/14/21 124/64   Pulse Readings from Last 3 Encounters:  01/30/22 68  01/02/22 (!) 57  12/14/21 (!) 58   Wt Readings from Last 3 Encounters:  01/30/22 201 lb 4 oz (91.3 kg)  01/09/22 199 lb (90.3 kg)  01/02/22 199 lb 2 oz (90.3 kg)    Assessment: Review of patient past medical history, allergies, medications, health status, including review of consultants reports, laboratory and other test data, was performed as part of comprehensive evaluation and provision of chronic care management services.   SDOH:  (Social Determinants of  Health) assessments and interventions performed:  SDOH Interventions    Flowsheet Row Most Recent Value  SDOH Interventions   Financial Strain Interventions Other (Comment)  [assisting with cost of diabetic supplies.]       CCM Care Plan  Allergies  Allergen Reactions   Clopidogrel Hives and Itching    Medications Reviewed Today     Reviewed by Cherre Robins, RPH-CPP (Pharmacist) on 03/23/22 at 1224  Med List Status: <None>   Medication Order Taking? Sig Documenting Provider Last Dose Status Informant  Accu-Chek FastClix Lancets MISC [id number] Yes Use daily to check blood sugar three times daily.  DXE11.9 Gary Pal, DO Taking Active   aspirin 325 MG tablet [id number] Yes Take 325 mg by mouth daily. [provider] Taking Active   atorvastatin (LIPITOR) 80 MG tablet [id number] Yes Take 1 tablet (80 mg total) by mouth daily. Gary Pal, DO Taking Active   citalopram (CELEXA) 40 MG tablet [id number] Yes Take 1 tablet (40 mg total) by mouth daily. Gary Pal, DO Taking Active   glipiZIDE (GLUCOTROL XL) 5 MG 24 hr tablet [id number] Yes TAKE 2 TABLETS BY MOUTH  DAILY WITH BREAKFAST Gary Pal, DO Taking Active   glucose blood (ACCU-CHEK AVIVA PLUS) test strip [id number] Yes Use three days to check blood sugar.  DX E11.9 Gary Pal, DO Taking Active   ipratropium (ATROVENT) 0.03 % nasal spray [id number] Yes Place 2 sprays into both nostrils 3 (three) times daily as needed for rhinitis. Sigurd Sos, MD Taking Active   levocetirizine Harlow Ohms) 5 MG tablet [id number] Yes Take 1 tablet (5 mg total) by mouth every evening. Gary Pal, DO Taking Active   lisinopril (ZESTRIL) 5 MG tablet [id number] Yes Take 1 tablet (5 mg total) by mouth daily. Gary Pal, DO Taking Active   metFORMIN (GLUCOPHAGE) 1000 MG tablet [id number] Yes Take 1 tablet (1,000 mg total) by mouth 2 (two) times daily with a meal. Gary Pal, DO Taking Active   metoprolol succinate (TOPROL-XL) 50 MG 24 hr tablet [id number] Yes Take 1 tablet (50  mg total) by mouth daily. Take with or immediately following a meal. Gary Pal, DO Taking Active   Mometasone Furoate [facility] HFA) 100 MCG/ACT AERO [id number] Yes Take 2 puffs twice daily. Gary Pal, DO Taking Active   montelukast (SINGULAIR) 10 MG tablet [id number] Yes Take 1 tablet (10 mg total) by mouth at bedtime. Gary Pal, DO Taking Active   nitroGLYCERIN (NITROSTAT) 0.4 MG SL tablet [id number] Yes Place 1 tablet (0.4 mg total) under the tongue every 5 (five) minutes x 3 doses as needed for chest pain. Gary Pal, DO Taking Active   pantoprazole (PROTONIX) 20 MG tablet [id number] Yes Take 1 tablet (20 mg total) by mouth daily. Gary Pal, DO Taking Active   pioglitazone (ACTOS) 45 MG tablet [id number] No Take 1 tablet (45 mg total) by mouth daily.  Patient not taking: Reported on 03/23/2022   Gary Pal, DO Not Taking Active   terbinafine (LAMISIL) 250 MG tablet [id number] Yes Take 1 tablet (250 mg total) by mouth daily. Gary Pal, DO Taking Active   vitamin C (ASCORBIC ACID) 500 MG tablet [id number] Yes Take 500 mg by mouth 2 (two) times daily. [provider] Taking Active   Zinc 50 MG TABS [id number] Yes Take 1 tablet by mouth daily. [provider] Taking Active             Patient Active Problem List   Diagnosis Date Noted   Hyperlipidemia 12/08/2021   Hypertension 12/08/2021   Seasonal allergies 08/03/2021   GAD (generalized anxiety disorder) 05/04/2020   Insomnia 06/03/2019   Diabetes mellitus without complication ([place]) 89/16/9450   Malaise 03/20/2018   Screening for malignant neoplasm of prostate 03/20/2018   Anxiety 11/11/2017   Major depressive disorder with single episode, in full remission ([place]) 11/11/2017   Primary osteoarthritis involving multiple  joints 11/11/2017   Diabetes mellitus due to underlying condition with unspecified complications ([place]) 38/88/2800   Change in bowel habits 06/20/2016   Risk for falls 01/23/2016   Type 2 diabetes mellitus with hyperglycemia, without long-term current use of insulin ([place]) 11/21/2015   CAD (coronary artery disease) 09/30/2015   Essential hypertension 08/09/2014   Hyperlipemia 08/09/2014   Vitamin D deficiency 08/09/2014   Sigmoid diverticulosis 07/08/2014   Encounter for colonoscopy due to history of adenomatous colonic polyps 05/20/2014   Benign prostatic hyperplasia 04/13/2014    Immunization History  Administered Date(s) Administered   Fluad Quad(high Dose 65+) 08/09/2020   Influenza Inj Mdck Quad Pf 11/11/2017, 08/07/2018   Influenza Split 10/04/2003, 08/07/2012   Influenza, High Dose Seasonal PF 09/17/2021   Influenza, Seasonal, Injecte, Preservative Fre 10/04/2003, 08/07/2012, 08/06/2013, 08/09/2014, 07/28/2015, 10/02/2016   Influenza,inj,Quad PF,6+ Mos 07/28/2015, 10/02/2016, 08/05/2019   Influenza,inj,quad, With Preservative 08/06/2013, 08/09/2014   Influenza-Unspecified 10/04/2003, 08/07/2012, 08/06/2013, 08/09/2014, 07/28/2015, 10/02/2016, 11/11/2017, 08/07/2018   PFIZER(Purple Top)SARS-COV-2 Vaccination 06/22/2020, 07/18/2020   PNEUMOCOCCAL CONJUGATE-20 10/02/2021   Pneumococcal Polysaccharide-23 04/21/2015, 04/21/2015, 08/09/2020   Pneumococcal-Unspecified 04/21/2015   Td 07/05/2003, 08/05/2019   Tdap 07/05/2003, 07/05/2003    Conditions to be addressed/monitored: CAD, HTN, HLD, DMII, Anxiety, Depression, BPH, Osteoarthritis, and insomnia  Care Plan : General Pharmacy (Adult)  Updates made by Cherre Robins, RPH-CPP since 03/23/2022 12:00 AM     Problem: Chronic Conditions: type 2 DM; HTN; HDL; GERD; insomnia; CAD;      Long-Range Goal: Provide education, support and care coordination for medication therapy and chronic conditions   Start Date: 01/19/2022  Priority:  High  Note:  Current Barriers:  Unable to independently afford treatment regimen Unable to independently monitor therapeutic efficacy Unable to achieve control of type 2 diabetes   Pharmacist Clinical Goal(s):  Over the next 90 days, patient will verbalize ability to afford treatment regimen achieve adherence to monitoring guidelines and medication adherence to achieve therapeutic efficacy achieve control of type 2 diabetes as evidenced by A1c < 7.0 maintain control of hyperlipidemia as evidenced by LDL < 70  through collaboration with PharmD and provider.   Interventions: 1:1 collaboration with Gary Pal, DO regarding development and update of comprehensive plan of care as evidenced by provider attestation and co-signature Inter-disciplinary care team collaboration (see longitudinal plan of care) Comprehensive medication review performed; medication list updated in electronic medical record  Diabetes: Uncontrolled Current treatment: Pioglitazone 62m - take 1 tablet daily (increased 01/2022) Glipizide ER 560m- Take 2 tablets = 108maily   Metformin 1000m72mtake 1 tablet twice a day Current glucose readings: patient did not have record available at time of visit.  Report hypoglycemia during night of 50 on 2 occasions but he stopped pioglitazone.  They did get testing supplies and he has been checking blood glucose 3 times  a day but reports that #300 strips was $86.23. Working with [facility] to see if can lower cost of testing supplies more.  Intervention:  Reviewed home blood glucose readings and reviewed goals  Fasting blood glucose goal (before meals) = 80 to 130 Blood glucose goal after a meal = less than 180  Educated on A1c goals and other possible therapies to lower blood glucose if increase in pioglitazone does not get A1c / blood glucose to goal Recommended continue current therapy. Reminded to monitor of increased swelling with increase in  pioglitazone dose (has experienced hand swelling in the past) Collaborated with [facility] regarding testing supplies.  Will consult with PCP regarding restarting pioglitazone and maybe lowering glipizide dose since glipizide is more likely to cause of hypoglycemia.  Reviewed how to Treat a Low Glucose Level:  If you have a low blood glucose less than 70, please eat / drink 15 grams of carbohydrates (4 oz of juice, soda, 4 glucose tablets, or 3-4 pieces of hard candy).  It is best so choose a "quick" source of sugar that does no contain fat (chocolate and peanut butter might take longer to increase your blood glucose) Wait 15 minutes and then recheck your blood glucose. If your blood glucose is still less than 70, eat another 15 grams of carbohydrates.  Wait another 15 minutes and recheck your glucose.  Continue this until your blood glucose is over 70. Once you blood glucose is over 70, eat a snack with protein in it to prevent your blood glucose from dropping again.   Hypertension: Controlled; blood pressure goal < 130/80 ASCVD 10 year risk = 28.8% current treatment: Metoprolol ER 50mg50mly Lisinopril 5mg d63my  BP Readings from Last 3 Encounters:  01/30/22 118/60  01/02/22 118/72  12/14/21 124/64  Current home readings: not checking Denies hypotensive/hypertensive symptoms Interventions:  Educated on importance in blood pressure control to prevent stroke and kidney disease Recommended continue current therapy  Hyperlipidemia / CAD: Controlled; Goal LDL < 70, triglycerides < 150 Current treatment: Atorvastatin 80mg d86m  Medications previously tried: none Intervention:  Educated on lipid goals Recommended Continue current therapy  Medication management Pharmacist Clinical Goal(s): Over the next 90 days, patient will work with PharmD and providers to maintain optimal  medication adherence Current pharmacy: [facility]  [facility] Interventions Comprehensive medication review performed. Reviewed refill history and assessed adherence Continue current medication management strategy Coordinated with mail order pharmacy to refill needed medications - atorvastatin, citalopram, glipizide, levocitirizine, lisinopril, metformin, metoprolol succinate, montelukast Assessed for LIS / Extra help with Medicare and patient is about $500 above limit for partial help.   Patient Goals/Self-Care Activities Over the next 90 days, patient will:  take medications as prescribed focus on medication adherence by using weekly pill reminder and filling prescription on time check glucose daily document, and provide at future appointments.   collaborate with provider on medication access solutions and changes in medications How to Treat a Low Glucose Level:  If you have a low blood glucose less than 70, please eat / drink 15 grams of carbohydrates (4 oz of juice, soda, 4 glucose tablets, or 3-4 pieces of hard candy).  It is best so choose a "quick" source of sugar that does no contain fat (chocolate and peanut butter might take longer to increase your blood glucose) Wait 15 minutes and then recheck your blood glucose. If your blood glucose is still less than 70, eat another 15 grams of carbohydrates.  Wait another 15 minutes and recheck your glucose.  Continue this until your blood glucose is over 70. Once you blood glucose is over 70, eat a snack with protein in it to prevent your blood glucose from dropping again.   Follow Up Plan: Telephone follow up appointment with care management team member scheduled for:   4 weeks ; will see PCP 04/03/2022     Medication Assistance:  Assessed for LIS - did not qualify for either full or partial benefits;   Patient's preferred pharmacy is:  [facility] [place], [place] [address] Phone:  [phone] Fax: [phone]  [facility] [id number] - [place], [place] - [address] [place] [address] Phone: [phone] Fax: [phone]   Follow Up:  Patient agrees to Care Plan and Follow-up.  Plan: Telephone follow up appointment with care management team member scheduled for:  4 weeks  Cherre Robins, PharmD Clinical Pharmacist [facility] [place] [facility]

## 2022-03-28 ENCOUNTER — Telehealth (HOSPITAL_COMMUNITY): Payer: Self-pay | Admitting: Emergency Medicine

## 2022-03-28 NOTE — Telephone Encounter (Signed)
Attempted to call patient regarding upcoming cardiac CT appointment. °Left message on voicemail with name and callback number °Ervine Witucki RN Navigator Cardiac Imaging °[facility] °[phone] Office °[phone] Cell ° °

## 2022-03-28 NOTE — Telephone Encounter (Signed)
error 

## 2022-03-28 NOTE — Telephone Encounter (Signed)
Reaching out to patient to offer assistance regarding upcoming cardiac imaging study; pt verbalizes understanding of appt date/time, parking situation and where to check in, pre-test NPO status and medications ordered, and verified current allergies; name and call back number provided for further questions should they arise Marchia Bond RN Navigator Cardiac Imaging [facility] [phone] office [phone] cell  Daily meds, holding allergy meds, Arrival 1100 Stent to "main artery" clarifying with ordering

## 2022-03-29 ENCOUNTER — Encounter (HOSPITAL_COMMUNITY): Payer: Self-pay

## 2022-03-29 ENCOUNTER — Ambulatory Visit (HOSPITAL_COMMUNITY)
Admission: RE | Admit: 2022-03-29 | Discharge: 2022-03-29 | Disposition: A | Discharge status: Home / Self Care | Payer: Medicare Other | Source: Ambulatory Visit | Attending: Cardiology | Admitting: Cardiology

## 2022-03-29 DIAGNOSIS — I2510 Atherosclerotic heart disease of native coronary artery without angina pectoris: Secondary | ICD-10-CM | POA: Diagnosis not present

## 2022-03-29 DIAGNOSIS — R931 Abnormal findings on diagnostic imaging of heart and coronary circulation: Secondary | ICD-10-CM | POA: Diagnosis not present

## 2022-03-29 MED ORDER — IOHEXOL 350 MG/ML SOLN
95.0000 mL | Freq: Once | INTRAVENOUS | Status: AC | PRN
  Administered 2022-03-29: 95 mL via INTRAVENOUS

## 2022-03-29 MED ORDER — NITROGLYCERIN 0.4 MG SL SUBL: 0.8000 mg | SUBLINGUAL_TABLET | Freq: Once | SUBLINGUAL | Status: DC

## 2022-03-29 MED ORDER — METOPROLOL TARTRATE 5 MG/5ML IV SOLN
5.0000 mg | INTRAVENOUS | Status: DC | PRN
Start: 2022-03-29 — End: 2022-03-30

## 2022-03-29 MED ORDER — NITROGLYCERIN 0.4 MG SL SUBL
SUBLINGUAL_TABLET | SUBLINGUAL | Status: AC
  Filled 2022-03-29: qty 2

## 2022-03-31 ENCOUNTER — Other Ambulatory Visit: Payer: Self-pay | Admitting: Family Medicine

## 2022-04-01 ENCOUNTER — Ambulatory Visit (HOSPITAL_COMMUNITY)
Admission: RE | Admit: 2022-04-01 | Discharge: 2022-04-01 | Disposition: A | Discharge status: Home / Self Care | Payer: Medicare Other | Source: Ambulatory Visit | Attending: Cardiology | Admitting: Cardiology

## 2022-04-01 ENCOUNTER — Other Ambulatory Visit: Payer: Self-pay | Admitting: Cardiology

## 2022-04-01 DIAGNOSIS — R931 Abnormal findings on diagnostic imaging of heart and coronary circulation: Secondary | ICD-10-CM

## 2022-04-01 NOTE — Progress Notes (Signed)
Order ffr

## 2022-04-03 ENCOUNTER — Ambulatory Visit (INDEPENDENT_AMBULATORY_CARE_PROVIDER_SITE_OTHER): Payer: Medicare Other | Admitting: Family Medicine

## 2022-04-03 ENCOUNTER — Encounter: Payer: Self-pay | Admitting: Family Medicine

## 2022-04-03 VITALS — BP 120/68 | HR 50 | Temp 98.0°F | Ht 72.0 in | Wt 210.2 lb

## 2022-04-03 DIAGNOSIS — I10 Essential (primary) hypertension: Secondary | ICD-10-CM

## 2022-04-03 DIAGNOSIS — E1165 Type 2 diabetes mellitus with hyperglycemia: Secondary | ICD-10-CM

## 2022-04-03 LAB — COMPREHENSIVE METABOLIC PANEL WITH GFR
ALT: 13 U/L (ref 0–53)
AST: 10 U/L (ref 0–37)
Albumin: 4.1 g/dL (ref 3.5–5.2)
Alkaline Phosphatase: 88 U/L (ref 39–117)
BUN: 17 mg/dL (ref 6–23)
CO2: 28 meq/L (ref 19–32)
Calcium: 8.8 mg/dL (ref 8.4–10.5)
Chloride: 104 meq/L (ref 96–112)
Creatinine, Ser: 0.88 mg/dL (ref 0.40–1.50)
GFR: 88.28 mL/min
Glucose, Bld: 93 mg/dL (ref 70–99)
Potassium: 4.1 meq/L (ref 3.5–5.1)
Sodium: 140 meq/L (ref 135–145)
Total Bilirubin: 1.1 mg/dL (ref 0.2–1.2)
Total Protein: 6 g/dL (ref 6.0–8.3)

## 2022-04-03 LAB — LIPID PANEL
Cholesterol: 104 mg/dL (ref 0–200)
HDL: 33.8 mg/dL — ABNORMAL LOW (ref >39.00)
LDL Cholesterol: 47 mg/dL (ref 0–99)
NonHDL: 70.22
Total CHOL/HDL Ratio: 3
Triglycerides: 114 mg/dL (ref 0.0–149.0)
VLDL: 22.8 mg/dL (ref 0.0–40.0)

## 2022-04-03 LAB — HEMOGLOBIN A1C: Hgb A1c MFr Bld: 7.2 % — ABNORMAL HIGH (ref 4.6–6.5)

## 2022-04-03 MED ORDER — REPAGLINIDE 1 MG PO TABS: 1.0000 mg | ORAL_TABLET | Freq: Three times a day (TID) | ORAL | 2 refills | Status: DC

## 2022-04-03 NOTE — Patient Instructions (Signed)
Give [place] 2-3 business days to get the results of your labs back.   Keep the diet clean and stay active.  Continue to monitor your sugars at home.  Let us know if you need anything.

## 2022-04-03 NOTE — Progress Notes (Signed)
Subjective:   Chief Complaint  Patient presents with   Follow-up    6 month     Gary Barnett is a 71 y.o. male here for follow-up of diabetes.  He is here with his wife. Shahir's self monitored glucose range is 70-90's in AM,   Patient confirms hypoglycemic reactions. He checks his glucose levels 2 time(s) per day. Patient does not require insulin.   Medications include: Metformin 1000 mg bid, Actos 45 mg/d, Glipizide 10 mg bid Diet is fair.  Exercise: walking, elliptical machine, lifting weights  Hypertension Patient presents for hypertension follow up. He does not monitor home blood pressures. He is compliant with medications- lisinopril 5 mg/d, Toprol XL 50 mg/d. Patient has these side effects of medication: none Diet/exercise as above. No CP or SOB.   Past Medical History:  Diagnosis Date   Anxiety 11/11/2017   Benign prostatic hyperplasia 04/13/2014   Formatting of this note might be different from the original. 10/1 IMO update   CAD (coronary artery disease) 09/30/2015   Change in bowel habits 06/20/2016   Diabetes mellitus due to underlying condition with unspecified complications ([place]) 7/49/4496   Diabetes mellitus without complication ([place])    Encounter for colonoscopy due to history of adenomatous colonic polyps 05/20/2014   Essential hypertension 08/09/2014   GAD (generalized anxiety disorder) 05/04/2020   Hyperlipemia 08/09/2014   Hyperlipidemia    Hypertension    Insomnia 06/03/2019   Major depressive disorder with single episode, in full remission ([place]) 11/11/2017   Malaise 03/20/2018   Primary osteoarthritis involving multiple joints 11/11/2017   Risk for falls 01/23/2016   Screening for malignant neoplasm of prostate 03/20/2018   Seasonal allergies 08/03/2021   Sigmoid diverticulosis 07/08/2014   Type 2 diabetes mellitus with hyperglycemia, without long-term current use of insulin ([place]) 11/21/2015   Vitamin D deficiency 08/09/2014     Related testing: Retinal exam:  Done Pneumovax: done  Objective:  BP 120/68   Pulse (!) 50   Temp 98 F (36.7 C) (Oral)   Ht 6' (1.829 m)   Wt 210 lb 4 oz (95.4 kg)   SpO2 99%   BMI 28.52 kg/m  General:  Well developed, well nourished, in no apparent distress Skin:  Warm, no pallor or diaphoresis Head:  Normocephalic, atraumatic Eyes:  Pupils equal and round, sclera anicteric without injection  Lungs:  CTAB, no access msc use Cardio: Regular rhythm, bradycardic, no bruits, no LE edema Psych: Age appropriate judgment and insight  Assessment:   Type 2 diabetes mellitus with hyperglycemia, without long-term current use of insulin (HCC) - Plan: repaglinide (PRANDIN) 1 MG tablet, Hemoglobin A1c, Comprehensive metabolic panel, Lipid panel  Essential hypertension   Plan:   Chronic, unsure if stable. Cont Metformin 1000 mg bid, Actos 45 mg/d. Stop Glipizide, change to Prandin 1 mg bid. Monitor sugars at home. Counseled on diet and exercise. Chronic, stable. Cont lisinopril 5 mg/d, Toprol XL 50 mg/d.  Shingrix recommended. F/u in 3-6 mo. The patient and his spouse voiced understanding and agreement to the plan.  [place], DO 04/03/22 12:05 PM

## 2022-04-04 ENCOUNTER — Other Ambulatory Visit: Payer: Self-pay | Admitting: Family Medicine

## 2022-04-04 DIAGNOSIS — E785 Hyperlipidemia, unspecified: Secondary | ICD-10-CM | POA: Diagnosis not present

## 2022-04-04 DIAGNOSIS — E1159 Type 2 diabetes mellitus with other circulatory complications: Secondary | ICD-10-CM

## 2022-04-04 DIAGNOSIS — F411 Generalized anxiety disorder: Secondary | ICD-10-CM

## 2022-04-04 DIAGNOSIS — Z87891 Personal history of nicotine dependence: Secondary | ICD-10-CM

## 2022-04-04 DIAGNOSIS — I2510 Atherosclerotic heart disease of native coronary artery without angina pectoris: Secondary | ICD-10-CM | POA: Diagnosis not present

## 2022-04-04 DIAGNOSIS — E1165 Type 2 diabetes mellitus with hyperglycemia: Secondary | ICD-10-CM

## 2022-04-04 DIAGNOSIS — Z7984 Long term (current) use of oral hypoglycemic drugs: Secondary | ICD-10-CM

## 2022-04-04 DIAGNOSIS — I10 Essential (primary) hypertension: Secondary | ICD-10-CM

## 2022-04-04 DIAGNOSIS — B351 Tinea unguium: Secondary | ICD-10-CM

## 2022-04-04 MED ORDER — ATORVASTATIN CALCIUM 80 MG PO TABS: 80.0000 mg | ORAL_TABLET | Freq: Every day | ORAL | 2 refills | Status: DC

## 2022-04-04 MED ORDER — TERBINAFINE HCL 250 MG PO TABS: 250.0000 mg | ORAL_TABLET | Freq: Every day | ORAL | 1 refills | Status: DC

## 2022-04-04 MED ORDER — CITALOPRAM HYDROBROMIDE 40 MG PO TABS: 40.0000 mg | ORAL_TABLET | Freq: Every day | ORAL | 2 refills | Status: DC

## 2022-04-04 MED ORDER — LISINOPRIL 5 MG PO TABS: 5.0000 mg | ORAL_TABLET | Freq: Every day | ORAL | 2 refills | Status: DC

## 2022-04-05 ENCOUNTER — Telehealth: Payer: Self-pay | Admitting: Cardiology

## 2022-04-05 NOTE — Telephone Encounter (Signed)
Left vm to call back

## 2022-04-05 NOTE — Telephone Encounter (Signed)
Patient's wife called stating she would like a copy of his two test he recently had done.  Patient can come by and pick it up today, please call him when it ready for pick up.

## 2022-04-05 NOTE — Telephone Encounter (Signed)
Pt's wife is requesting call back in regards to results.

## 2022-04-05 NOTE — Telephone Encounter (Signed)
Spoke with Gary Barnett per DPR who states that the patients PCP was telling them he has additional areas of narrowing. I discussed the results with Gary Barnett and she wanted to know if we need to start new medications. Advised I would route to Dr. Geraldo Pitter for his recommendations.  CAD-RADS 3. Moderate stenosis (LAD, CX, Ramus). Consider symptom-guided anti-ischemic pharmacotherapy as well as risk factor modification per guideline directed care.

## 2022-04-06 ENCOUNTER — Telehealth: Payer: Self-pay | Admitting: Family Medicine

## 2022-04-06 NOTE — Chronic Care Management (AMB) (Signed)
Per pharm D notes - phone appt completed with pharmacy on 03/23/2022.

## 2022-04-06 NOTE — Telephone Encounter (Signed)
Left message for patient to call back and schedule Medicare Annual Wellness Visit (AWV).   Please offer to do virtually or by telephone.  Left office number and my jabber [phone].  AWVI eligible as of 08/05/2012  Please schedule at anytime with Nurse Health Advisor.

## 2022-04-09 NOTE — Telephone Encounter (Signed)
Recommendations reviewed with Gary Barnett per DPR as per Dr. Julien Nordmann note.  Gary Barnett verbalized understanding and had no additional questions.

## 2022-04-20 ENCOUNTER — Telehealth: Payer: Self-pay | Admitting: Pharmacist

## 2022-04-20 NOTE — Telephone Encounter (Signed)
Called to check in with patient regarding home blood glucose readings since changed medication from glipizide to repaglinide.  Unable to reach patient. LM on VM with my contact number [phone].

## 2022-04-26 ENCOUNTER — Encounter: Payer: Self-pay | Admitting: Cardiology

## 2022-04-26 ENCOUNTER — Ambulatory Visit: Payer: Medicare Other | Admitting: Cardiology

## 2022-04-26 VITALS — BP 98/60 | HR 74 | Ht 72.0 in | Wt 204.0 lb

## 2022-04-26 DIAGNOSIS — I10 Essential (primary) hypertension: Secondary | ICD-10-CM | POA: Diagnosis not present

## 2022-04-26 DIAGNOSIS — I2510 Atherosclerotic heart disease of native coronary artery without angina pectoris: Secondary | ICD-10-CM | POA: Diagnosis not present

## 2022-04-26 DIAGNOSIS — E088 Diabetes mellitus due to underlying condition with unspecified complications: Secondary | ICD-10-CM

## 2022-04-26 DIAGNOSIS — E782 Mixed hyperlipidemia: Secondary | ICD-10-CM | POA: Diagnosis not present

## 2022-04-26 MED ORDER — ASPIRIN 81 MG PO TBEC: 81.0000 mg | DELAYED_RELEASE_TABLET | Freq: Every day | ORAL | 3 refills | Status: AC

## 2022-04-26 NOTE — Progress Notes (Signed)
Cardiology Office Note:    Date:  04/26/2022   ID:  Gary Barnett, DOB 21-Apr-1951, MRN [id number]  PCP:  Shelda Pal, DO  Cardiologist:  Jenean Lindau, MD   Referring MD: Shelda Pal*    ASSESSMENT:    1. Coronary artery disease involving native coronary artery of native heart without angina pectoris   2. Essential hypertension   3. Diabetes mellitus due to underlying condition with unspecified complications ([place])   4. Mixed hyperlipidemia    PLAN:    In order of problems listed above:  Coronary artery disease: Secondary prevention stressed with the patient.  Importance of compliance with diet medication stressed and vocalized understanding.  CT coronary angiography report was discussed with the patient at length and questions were answered to her satisfaction.  His wife was present for the visit and she had questions 2.  He exercises on a regular basis without any symptoms and intensely and happy about it. Essential hypertension: Blood pressure stable.  Mixed dyslipidemia: Lipids were reviewed and diet was emphasized. Mixed dyslipidemia: Lipids were reviewed and they are fine.  And followed by primary care. Diabetes mellitus: Elevated hemoglobin A1c.  I cautioned the patient about this.  Importance of compliance with diet and following medication instructions by primary care were emphasized and he promises to do so. Patient will be seen in follow-up appointment in 6 months or earlier if the patient has any concerns    Medication Adjustments/Labs and Tests Ordered: Current medicines are reviewed at length with the patient today.  Concerns regarding medicines are outlined above.  No orders of the defined types were placed in this encounter.  Meds ordered this encounter  Medications   aspirin EC 81 MG tablet    Sig: Take 1 tablet (81 mg total) by mouth daily. Swallow whole.    Dispense:  90 tablet    Refill:  3     No chief complaint on file.     History of Present Illness:    Gary Barnett is a 71 y.o. male.  Patient has past medical history of coronary artery disease.  He has history of essential hypertension dyslipidemia and diabetes mellitus.  He denies any problems at this time and takes care of activities of daily living.  He goes very rigorously in the gym and exercises without any symptoms.  At the time of my evaluation, the patient is alert awake oriented and in no distress.  Past Medical History:  Diagnosis Date   Anxiety 11/11/2017   Benign prostatic hyperplasia 04/13/2014   Formatting of this note might be different from the original. 10/1 IMO update   CAD (coronary artery disease) 09/30/2015   Change in bowel habits 06/20/2016   Diabetes mellitus due to underlying condition with unspecified complications ([place]) 7/56/4332   Diabetes mellitus without complication ([place])    Encounter for colonoscopy due to history of adenomatous colonic polyps 05/20/2014   Essential hypertension 08/09/2014   GAD (generalized anxiety disorder) 05/04/2020   Hyperlipemia 08/09/2014   Hyperlipidemia    Hypertension    Insomnia 06/03/2019   Major depressive disorder with single episode, in full remission ([place]) 11/11/2017   Malaise 03/20/2018   Primary osteoarthritis involving multiple joints 11/11/2017   Risk for falls 01/23/2016   Screening for malignant neoplasm of prostate 03/20/2018   Seasonal allergies 08/03/2021   Sigmoid diverticulosis 07/08/2014   Type 2 diabetes mellitus with hyperglycemia, without long-term current use of insulin ([place]) 11/21/2015   Vitamin D deficiency 08/09/2014  Past Surgical History:  Procedure Laterality Date   CORONARY STENT PLACEMENT     Thinks left main, around age 10    Current Medications: Current Meds  Medication Sig   Accu-Chek FastClix Lancets MISC Use daily to check blood sugar three times daily.  DXE11.9   aspirin EC 81 MG tablet Take 1 tablet (81 mg total) by mouth daily. Swallow whole.   atorvastatin  (LIPITOR) 80 MG tablet Take 1 tablet (80 mg total) by mouth daily.   citalopram (CELEXA) 40 MG tablet Take 1 tablet (40 mg total) by mouth daily.   glucose blood (ACCU-CHEK AVIVA PLUS) test strip Use three days to check blood sugar.  DX E11.9   ipratropium (ATROVENT) 0.03 % nasal spray Place 2 sprays into both nostrils 3 (three) times daily as needed for rhinitis.   levocetirizine (XYZAL) 5 MG tablet Take 1 tablet (5 mg total) by mouth every evening.   lisinopril (ZESTRIL) 5 MG tablet Take 1 tablet (5 mg total) by mouth daily.   metFORMIN (GLUCOPHAGE) 1000 MG tablet Take 1 tablet (1,000 mg total) by mouth 2 (two) times daily with a meal.   metoprolol succinate (TOPROL-XL) 50 MG 24 hr tablet Take 1 tablet (50 mg total) by mouth daily. Take with or immediately following a meal.   montelukast (SINGULAIR) 10 MG tablet Take 1 tablet (10 mg total) by mouth at bedtime.   nitroGLYCERIN (NITROSTAT) 0.4 MG SL tablet Place 1 tablet (0.4 mg total) under the tongue every 5 (five) minutes x 3 doses as needed for chest pain.   pantoprazole (PROTONIX) 20 MG tablet Take 1 tablet (20 mg total) by mouth daily.   repaglinide (PRANDIN) 1 MG tablet Take 1 tablet (1 mg total) by mouth 3 (three) times daily before meals.   terbinafine (LAMISIL) 250 MG tablet Take 1 tablet (250 mg total) by mouth daily.   vitamin C (ASCORBIC ACID) 500 MG tablet Take 500 mg by mouth 2 (two) times daily.   Zinc 50 MG TABS Take 1 tablet by mouth daily.   [DISCONTINUED] aspirin 325 MG tablet Take 325 mg by mouth daily.     Allergies:   Clopidogrel   Social History   Socioeconomic History   Marital status: Married    Spouse name: Not on file   Number of children: Not on file   Years of education: Not on file   Highest education level: Not on file  Occupational History   Not on file  Tobacco Use   Smoking status: Former    Packs/day: 1.00    Years: 25.00    Total pack years: 25.00    Types: Cigarettes    Quit date: 1     Years since quitting: 29.4   Smokeless tobacco: Never  Vaping Use   Vaping Use: Never used  Substance and Sexual Activity   Alcohol use: No   Drug use: No   Sexual activity: Not on file  Other Topics Concern   Not on file  Social History Narrative   Not on file   Social Determinants of Health   Financial Resource Strain: Medium Risk (03/23/2022)   Overall Financial Resource Strain (CARDIA)    Difficulty of Paying Living Expenses: Somewhat hard  Food Insecurity: Not on file  Transportation Needs: Not on file  Physical Activity: Not on file  Stress: Not on file  Social Connections: Not on file     Family History: The patient's family history includes Heart attack in his brother; Heart  disease in his brother; Hyperlipidemia in his mother; Hypertension in his father.  ROS:   Please see the history of present illness.    All other systems reviewed and are negative.  EKGs/Labs/Other Studies Reviewed:    The following studies were reviewed today: Coronary angiography report was discussed with the patient at length   Recent Labs: 10/02/2021: Hemoglobin 13.8; Platelets 204.0 04/03/2022: ALT 13; BUN 17; Creatinine, Ser 0.88; Potassium 4.1; Sodium 140  Recent Lipid Panel    Component Value Date/Time   CHOL 104 04/03/2022 1053   CHOL 149 02/16/2020 1139   TRIG 114.0 04/03/2022 1053   HDL 33.80 (L) 04/03/2022 1053   HDL 30 (L) 02/16/2020 1139   CHOLHDL 3 04/03/2022 1053   VLDL 22.8 04/03/2022 1053   LDLCALC 47 04/03/2022 1053   LDLCALC 40 08/09/2020 1359    Physical Exam:    VS:  BP 98/60   Pulse 74   Ht 6' (1.829 m)   Wt 204 lb 0.6 oz (92.6 kg)   SpO2 97%   BMI 27.67 kg/m     Wt Readings from Last 3 Encounters:  04/26/22 204 lb 0.6 oz (92.6 kg)  04/03/22 210 lb 4 oz (95.4 kg)  01/30/22 201 lb 4 oz (91.3 kg)     GEN: Patient is in no acute distress HEENT: Normal NECK: No JVD; No carotid bruits LYMPHATICS: No lymphadenopathy CARDIAC: Hear sounds regular, 2/6  systolic murmur at the apex. RESPIRATORY:  Clear to auscultation without rales, wheezing or rhonchi  ABDOMEN: Soft, non-tender, non-distended MUSCULOSKELETAL:  No edema; No deformity  SKIN: Warm and dry NEUROLOGIC:  Alert and oriented x 3 PSYCHIATRIC:  Normal affect   Signed, Jenean Lindau, MD  04/26/2022 10:36 AM    [place]

## 2022-04-26 NOTE — Patient Instructions (Signed)

## 2022-04-27 ENCOUNTER — Telehealth: Payer: Self-pay | Admitting: *Deleted

## 2022-04-27 NOTE — Chronic Care Management (AMB) (Signed)
  Chronic Care Management Note  04/27/2022 Name: Gary Barnett MRN: [id number] DOB: 07/30/1951  Gary Barnett is a 71 y.o. year old male who is a primary care patient of Shelda Pal, DO and is actively engaged with the care management team. I reached out to [name] by phone today to assist with re-scheduling a follow up visit with the Pharmacist  Follow up plan: Unsuccessful telephone outreach attempt made. A HIPAA compliant phone message was left for the patient providing contact information and requesting a return call.   Julian Hy, [place] Management  Direct Dial: [phone]

## 2022-05-01 NOTE — Chronic Care Management (AMB) (Signed)
  Chronic Care Management Note  05/01/2022 Name: Gary Barnett MRN: [id number] DOB: January 22, 1951  Gary Barnett is a 71 y.o. year old male who is a primary care patient of Shelda Pal, DO and is actively engaged with the care management team. I reached out to [name] by phone today to assist with re-scheduling a follow up visit with the Pharmacist  Follow up plan: 2nd Unsuccessful telephone outreach attempt made. A HIPAA compliant phone message was left for the patient providing contact information and requesting a return call.   Julian Hy, [place] Management  Direct Dial: [phone]

## 2022-05-04 ENCOUNTER — Telehealth: Payer: Self-pay | Admitting: Cardiology

## 2022-05-04 NOTE — Telephone Encounter (Signed)
Spouse states that pt would like to do a provider switch. Please advise

## 2022-05-18 ENCOUNTER — Ambulatory Visit: Payer: Medicare Other | Admitting: Cardiology

## 2022-05-29 DIAGNOSIS — I10 Essential (primary) hypertension: Secondary | ICD-10-CM | POA: Diagnosis not present

## 2022-05-29 DIAGNOSIS — E7800 Pure hypercholesterolemia, unspecified: Secondary | ICD-10-CM | POA: Diagnosis not present

## 2022-05-29 DIAGNOSIS — I252 Old myocardial infarction: Secondary | ICD-10-CM | POA: Diagnosis not present

## 2022-05-29 DIAGNOSIS — I2510 Atherosclerotic heart disease of native coronary artery without angina pectoris: Secondary | ICD-10-CM | POA: Diagnosis not present

## 2022-06-22 ENCOUNTER — Ambulatory Visit: Payer: Medicare Other | Admitting: Cardiology

## 2022-06-22 DIAGNOSIS — I252 Old myocardial infarction: Secondary | ICD-10-CM | POA: Diagnosis not present

## 2022-06-22 DIAGNOSIS — I2510 Atherosclerotic heart disease of native coronary artery without angina pectoris: Secondary | ICD-10-CM | POA: Diagnosis not present

## 2022-06-22 DIAGNOSIS — I10 Essential (primary) hypertension: Secondary | ICD-10-CM | POA: Diagnosis not present

## 2022-06-22 DIAGNOSIS — E7800 Pure hypercholesterolemia, unspecified: Secondary | ICD-10-CM | POA: Diagnosis not present

## 2022-06-26 DIAGNOSIS — E7800 Pure hypercholesterolemia, unspecified: Secondary | ICD-10-CM | POA: Diagnosis not present

## 2022-06-26 DIAGNOSIS — Z7984 Long term (current) use of oral hypoglycemic drugs: Secondary | ICD-10-CM | POA: Diagnosis not present

## 2022-06-26 DIAGNOSIS — Z79899 Other long term (current) drug therapy: Secondary | ICD-10-CM | POA: Diagnosis not present

## 2022-06-26 DIAGNOSIS — E119 Type 2 diabetes mellitus without complications: Secondary | ICD-10-CM | POA: Diagnosis not present

## 2022-06-26 DIAGNOSIS — Z955 Presence of coronary angioplasty implant and graft: Secondary | ICD-10-CM | POA: Diagnosis not present

## 2022-06-26 DIAGNOSIS — I252 Old myocardial infarction: Secondary | ICD-10-CM | POA: Diagnosis not present

## 2022-06-26 DIAGNOSIS — I25118 Atherosclerotic heart disease of native coronary artery with other forms of angina pectoris: Secondary | ICD-10-CM | POA: Diagnosis not present

## 2022-06-26 DIAGNOSIS — I10 Essential (primary) hypertension: Secondary | ICD-10-CM | POA: Diagnosis not present

## 2022-06-26 DIAGNOSIS — I2510 Atherosclerotic heart disease of native coronary artery without angina pectoris: Secondary | ICD-10-CM | POA: Diagnosis not present

## 2022-06-26 NOTE — Chronic Care Management (AMB) (Signed)
  Chronic Care Management Note  06/26/2022 Name: Gary Barnett MRN: [id number] DOB: 05-11-1951  Gary Barnett is a 71 y.o. year old male who is a primary care patient of Shelda Pal, DO and is actively engaged with the care management team. I reached out to [name] by phone today to assist with re-scheduling a follow up visit with the Pharmacist  Follow up plan: We have been unable to make contact with the patient for follow up. The care management team is available to follow up with the patient after provider conversation with the patient regarding recommendation for care management engagement and subsequent re-referral to the care management team.   Julian Hy, [place] Direct Dial: [phone]

## 2022-06-29 ENCOUNTER — Telehealth: Payer: Self-pay | Admitting: Family Medicine

## 2022-06-29 NOTE — Telephone Encounter (Signed)
LVM for patient to call back and schedule a medicare annual welness visit at their earliest convinience.   

## 2022-07-03 ENCOUNTER — Ambulatory Visit: Payer: Medicare Other | Admitting: Cardiology

## 2022-07-10 ENCOUNTER — Telehealth: Payer: Self-pay | Admitting: Family Medicine

## 2022-07-10 NOTE — Telephone Encounter (Signed)
Patient is already enrolled in Chronic Care Management but we were unable to reach him for appointment in June 2023. Scheduler also tried to reach out in June. Will forward message to scheduler to get Gary Barnett back in with Clinical Pharmacist Practitioner.

## 2022-07-10 NOTE — Telephone Encounter (Signed)
Jonelle Sidle (spouse) called wanting to speak with Tammy to establish CCM. Tammy was on the line with another pt at the time and advised that a note would have to be sent back to return her call. Jonelle Sidle acknowledged understanding.

## 2022-07-12 ENCOUNTER — Other Ambulatory Visit: Payer: Self-pay | Admitting: Family Medicine

## 2022-07-12 DIAGNOSIS — E1165 Type 2 diabetes mellitus with hyperglycemia: Secondary | ICD-10-CM

## 2022-07-12 NOTE — Chronic Care Management (AMB) (Signed)
  Chronic Care Management Note  07/12/2022 Name: Andra Matsuo MRN: [id number] DOB: 03-03-1951  Idriss Quackenbush is a 71 y.o. year old male who is a primary care patient of Shelda Pal, DO and is actively engaged with the care management team. I reached out to [name] by phone today to assist with re-scheduling a follow up visit with the Pharmacist  Follow up plan: Telephone appointment with care management team member scheduled for: 07/18/2022  Julian Hy, [place] Direct Dial: [phone]

## 2022-07-13 ENCOUNTER — Other Ambulatory Visit: Payer: Self-pay | Admitting: Family Medicine

## 2022-07-13 DIAGNOSIS — E1165 Type 2 diabetes mellitus with hyperglycemia: Secondary | ICD-10-CM

## 2022-07-13 MED ORDER — REPAGLINIDE 1 MG PO TABS: ORAL_TABLET | ORAL | 0 refills | Status: DC

## 2022-07-13 NOTE — Chronic Care Management (AMB) (Signed)
  Chronic Care Management Note  07/13/2022 Name: Abem Shaddix MRN: [id number] DOB: 11/09/1950  Durwin Davisson is a 71 y.o. year old male who is a primary care patient of Shelda Pal, DO and is actively engaged with the care management team. I reached out to [name] by phone today to assist with re-scheduling a follow up visit with the Pharmacist  Follow up plan: Telephone appointment with care management team member scheduled for: 07/18/2022  Julian Hy, [place] Direct Dial: [phone]

## 2022-07-13 NOTE — Telephone Encounter (Signed)
The patients wife called to request some refills. She stated he is completely out of Glipizide and needs more/do not see on his list. Also the Lamisil is not working/ok to stop?

## 2022-07-13 NOTE — Telephone Encounter (Signed)
PCP said ok to stop the Lamisil. And he is not to take the Glipizide if taking Prandin and he is taking Prandin. Called informed the patient/wife of message. She verbalized understanding.

## 2022-07-18 ENCOUNTER — Ambulatory Visit (INDEPENDENT_AMBULATORY_CARE_PROVIDER_SITE_OTHER): Payer: Medicare Other | Admitting: Pharmacist

## 2022-07-18 DIAGNOSIS — Z20822 Contact with and (suspected) exposure to covid-19: Secondary | ICD-10-CM | POA: Diagnosis not present

## 2022-07-18 DIAGNOSIS — J302 Other seasonal allergic rhinitis: Secondary | ICD-10-CM

## 2022-07-18 DIAGNOSIS — E1165 Type 2 diabetes mellitus with hyperglycemia: Secondary | ICD-10-CM

## 2022-07-18 DIAGNOSIS — J069 Acute upper respiratory infection, unspecified: Secondary | ICD-10-CM | POA: Diagnosis not present

## 2022-07-18 DIAGNOSIS — F411 Generalized anxiety disorder: Secondary | ICD-10-CM

## 2022-07-18 MED ORDER — METFORMIN HCL 1000 MG PO TABS: 1000.0000 mg | ORAL_TABLET | Freq: Two times a day (BID) | ORAL | 2 refills | Status: DC

## 2022-07-18 MED ORDER — CITALOPRAM HYDROBROMIDE 40 MG PO TABS: 40.0000 mg | ORAL_TABLET | Freq: Every day | ORAL | 0 refills | Status: DC

## 2022-07-18 MED ORDER — PANTOPRAZOLE SODIUM 20 MG PO TBEC: 20.0000 mg | DELAYED_RELEASE_TABLET | Freq: Every day | ORAL | 0 refills | Status: DC

## 2022-07-18 MED ORDER — BRILINTA 90 MG PO TABS: 90.0000 mg | ORAL_TABLET | Freq: Two times a day (BID) | ORAL | 0 refills | Status: DC

## 2022-07-18 MED ORDER — METOPROLOL SUCCINATE ER 50 MG PO TB24: 50.0000 mg | ORAL_TABLET | Freq: Every day | ORAL | 0 refills | Status: DC

## 2022-07-18 MED ORDER — MONTELUKAST SODIUM 10 MG PO TABS: 10.0000 mg | ORAL_TABLET | Freq: Every day | ORAL | 0 refills | Status: DC

## 2022-07-18 MED ORDER — LISINOPRIL 5 MG PO TABS: 5.0000 mg | ORAL_TABLET | Freq: Every day | ORAL | 0 refills | Status: DC

## 2022-07-18 MED ORDER — LEVOCETIRIZINE DIHYDROCHLORIDE 5 MG PO TABS: 5.0000 mg | ORAL_TABLET | Freq: Every evening | ORAL | 0 refills | Status: DC

## 2022-07-22 NOTE — Chronic Care Management (AMB) (Signed)
Chronic Care Management Pharmacy Note  07/22/2022 Name:  Gary Barnett MRN:  [id number] DOB:  May 27, 1951  Summary: Patient has started Brilinta 4m twice a day after stent placement 06/2022. Per patient he is not able to afford cost of Brilinta. Assisted patient with [place] for [place] program. Gary had applied for LIS earlier this year but patient was about $500 over limit. [place] thru 11/04/2022. Sent Rx to [facility] which is [place] and [place] program pharmacy.  [facility]   A1c not at goal. Last A1c checked by Dr TElonda Huskywas 8.4% (06/26/2021). Current regimen - metformin and repaglinide. Per patient home blood glucose has been 128-150 (almost all readings have been fasting in morning). Since A1c elevated, will discuss with PCP about adding Farxiag 169mdaily. Should be able to get [facility] medication [facility] program  Subjective: Gary Barnett an [age].o. year old male who is a primary patient of Gary Barnett.  The CCM team was consulted for assistance with disease management and care coordination needs.    Engaged with patient by telephone for initial visit in response to provider referral for pharmacy case management and/or care coordination services.   Consent to Services:  The patient was given the following information about Chronic Care Management services today, agreed to services, and gave verbal consent: 1. CCM service includes personalized support from designated clinical staff supervised by the primary care provider, including individualized plan of care and coordination with other care providers 2. 24/7 contact phone numbers for assistance for urgent and routine care needs. 3. Service will only be billed when office clinical staff spend 20 minutes or more in a month to coordinate care. 4. Only one  practitioner may furnish and bill the service in a calendar month. 5.The patient may stop CCM services at any time (effective at the end of the month) by phone call to the office staff. 6. The patient will be responsible for cost sharing (co-pay) of up to 20% of the service fee (after annual deductible is met). Patient agreed to services and consent obtained.  Patient Care Team: Gary Barnett as PCP - General (Family Medicine) EcCherre RobinsRPH-CPP (Pharmacist)  Recent office visits: 01/30/2022 - PCP (Dr WeNani RavensF/U type 2 DM. Home BG improving. Offered Ozempic but patient declined. BMP checked - BG was 158. 01/02/2022 - PCP (Dr WeNani RavensSeen for type 2 DM. Not checking BG, meter broken.  11/28/21-Gary PaNolene EbbsDO (PCP) Seen for edema. [facility]Gary PaNolene EbbsDO (PCP) Seen for a nail problem. Labs ordered. Start on terbinafine (LAMISIL) 250 MG tablet for onychomycosis.  10/02/21-Gary PaNolene EbbsDO (PCP) Seen for a 6 month follow up visit. Labs ordered. Given Pneumococcal conjugate vaccine 20-valent (Prevnar 20). Follow up in 6 months.  08/25/21-Gary PaNolene EbbsDO (PCP, Video visit) Seen for COVID-19 issues. Start on Mometasone Furoate ([facility]) 100 MCG/ACT AERO, benzonatate (TESSALON) 200 MG capsule. 08/03/21-Gary PaNolene EbbsDO (PCP, Video visit) Seen for allergies. Start on benzonatate (TESSALON) 200 MG capsule and montelukast (SINGULAIR) 10 MG tablet. 07/29/21-(Urgent care) Brain DeCarney CornersSeen for suspected COVID-19 infection.    Recent consult visits:  12/14/21-(Cardiology) RaReita ClicheRevankar, MD. General follow up visit. Follow up in 6 months.  12/01/21-(Allergy Specialist) ErSigurd SosMD. Seen for chronic rhinitis. Start Atrovent (Ipratropium Bromide)  1-2 sprays in each nostril up to 3 times a day as needed for runny nose/post nasal drip/drainage. 10/17/21-(Urology) Ferdie Ping, MD. Annual check up.  07/27/21-(General surgery) Tommie Sams, MD.   Hospital visits:  None in previous 6 months  Objective:  Lab Results  Component Value Date   CREATININE 0.88 04/03/2022   CREATININE 0.96 01/30/2022   CREATININE 0.81 10/02/2021    Lab Results  Component Value Date   HGBA1C 7.2 (H) 04/03/2022   Last diabetic Eye exam: No results found for: "HMDIABEYEEXA"  Last diabetic Foot exam: No results found for: "HMDIABFOOTEX"      Component Value Date/Time   CHOL 104 04/03/2022 1053   CHOL 149 02/16/2020 1139   TRIG 114.0 04/03/2022 1053   HDL 33.80 (L) 04/03/2022 1053   HDL 30 (L) 02/16/2020 1139   CHOLHDL 3 04/03/2022 1053   VLDL 22.8 04/03/2022 1053   LDLCALC 47 04/03/2022 1053   LDLCALC 40 08/09/2020 1359       Latest Ref Rng & Units 04/03/2022   10:53 AM 11/28/2021   11:54 AM 11/14/2021    1:56 PM  Hepatic Function  Total Protein 6.0 - 8.3 g/dL 6.0  6.4  6.6   Albumin 3.5 - 5.2 g/dL 4.1  4.3  4.4   AST 0 - 37 U/L _0 ALT 0 - 53 U/L _1 Alk Phosphatase 39 - 117 U/L 88  110  124   Total Bilirubin 0.2 - 1.2 mg/dL 1.1  0.6  1.1   Bilirubin, Direct 0.0 - 0.3 mg/dL  0.1  0.2     No results found for: "TSH", "FREET4"     Latest Ref Rng & Units 10/02/2021    1:59 PM 08/09/2020    1:59 PM 06/03/2019    1:30 PM  CBC  WBC 4.0 - 10.5 K/uL 6.6  6.6  7.3   Hemoglobin 13.0 - 17.0 g/dL 13.8  13.4  14.7   Hematocrit 39.0 - 52.0 % 42.1  39.2  43.4   Platelets 150.0 - 400.0 K/uL 204.0  216  214.0     No results found for: "VD25OH"  Clinical ASCVD: Yes  The ASCVD Risk score (Arnett DK, et al., 2019) failed to calculate for the following reasons:   The patient has a prior MI or stroke diagnosis     Social History   Tobacco Use  Smoking Status Former   Packs/day: 1.00   Years: 25.00   Total pack years: 25.00   Types: Cigarettes   Quit date: 60   Years since quitting: 29.7  Smokeless Tobacco Never   BP Readings  from Last 3 Encounters:  04/26/22 98/60  04/03/22 120/68  03/29/22 (!) 144/76   Pulse Readings from Last 3 Encounters:  04/26/22 74  04/03/22 (!) 50  03/29/22 (!) 55   Wt Readings from Last 3 Encounters:  04/26/22 204 lb 0.6 oz (92.6 kg)  04/03/22 210 lb 4 oz (95.4 kg)  01/30/22 201 lb 4 oz (91.3 kg)    Assessment: Review of patient past medical history, allergies, medications, health status, including review of consultants reports, laboratory and other test data, was performed as part of comprehensive evaluation and provision of chronic care management services.   SDOH:  (Social Determinants of Health) assessments and interventions performed:  SDOH Interventions    Flowsheet Row Chronic Care Management from [facility]/23/2022 in [facility]  SDOH Interventions   Financial Strain Interventions Other (Comment)  [assisting with cost of diabetic supplies.]       CCM Care Plan  Allergies  Allergen Reactions   Clopidogrel Hives and Itching    Medications Reviewed Today     Reviewed by Cherre Robins, RPH-CPP (Pharmacist) on 07/18/22 at 26  Med List Status: <None>   Medication Order Taking? Sig Documenting Provider Last Dose Status Informant  Accu-Chek FastClix Lancets MISC [id number] Yes Use daily to check blood sugar three times daily.  DXE11.9 Shelda Pal, DO Taking Active   aspirin EC 81 MG tablet [id number] Yes Take 1 tablet (81 mg total) by mouth daily. Swallow whole. Revankar, Reita Cliche, MD Taking Active   atorvastatin (LIPITOR) 80 MG tablet [id number] Yes Take 1 tablet (80 mg total) by mouth daily. Shelda Pal, DO Taking Active   BRILINTA 90 MG TABS tablet [id number] Yes Take 90 mg by mouth 2 (two) times daily. [provider] Taking Active   citalopram (CELEXA) 40 MG tablet [id number] Yes Take 1 tablet (40 mg total) by mouth daily. Shelda Pal, DO Taking Active   glucose blood (ACCU-CHEK AVIVA  PLUS) test strip [id number] Yes Use three days to check blood sugar.  DX E11.9 Shelda Pal, DO Taking Active   ipratropium (ATROVENT) 0.03 % nasal spray [id number] No Place 2 sprays into both nostrils 3 (three) times daily as needed for rhinitis.  Patient not taking: Reported on 07/18/2022   Clemon Chambers, MD Not Taking Active   levocetirizine (XYZAL) 5 MG tablet [id number] Yes Take 1 tablet (5 mg total) by mouth every evening. Shelda Pal, DO Taking Active   lisinopril (ZESTRIL) 5 MG tablet [id number] Yes Take 1 tablet (5 mg total) by mouth daily. Shelda Pal, DO Taking Active   metFORMIN (GLUCOPHAGE) 1000 MG tablet [id number] Yes Take 1 tablet (1,000 mg total) by mouth 2 (two) times daily with a meal. Shelda Pal, DO Taking Active   metoprolol succinate (TOPROL-XL) 50 MG 24 hr tablet [id number] Yes Take 1 tablet (50 mg total) by mouth daily. Take with or immediately following a meal. Shelda Pal, DO Taking Active   montelukast (SINGULAIR) 10 MG tablet [id number] No Take 1 tablet (10 mg total) by mouth at bedtime.  Patient not taking: Reported on 07/18/2022   Shelda Pal, DO Not Taking Active   nitroGLYCERIN (NITROSTAT) 0.4 MG SL tablet [id number]  Place 1 tablet (0.4 mg total) under the tongue every 5 (five) minutes x 3 doses as needed for chest pain. Shelda Pal, DO  Active   pantoprazole (PROTONIX) 20 MG tablet [id number] Yes Take 1 tablet (20 mg total) by mouth daily. Shelda Pal, DO Taking Active   repaglinide (PRANDIN) 1 MG tablet [id number] Yes TAKE 1 TABLET(1 MG) BY MOUTH THREE TIMES DAILY BEFORE MEALS Wendling, Crosby Oyster, DO Taking Active   vitamin C (ASCORBIC ACID) 500 MG tablet [id number] Yes Take 500 mg by mouth 2 (two) times daily. [provider] Taking Active   Zinc 50 MG TABS [id number] Yes Take 1 tablet by mouth daily. [provider] Taking Active             Patient  Active Problem List   Diagnosis Date Noted   Hyperlipidemia 12/08/2021   Hypertension 12/08/2021   Seasonal allergies 08/03/2021   GAD (generalized anxiety disorder) 05/04/2020   Insomnia 06/03/2019   Diabetes mellitus without complication ([place]) 83/41/9622   Malaise 03/20/2018  Screening for malignant neoplasm of prostate 03/20/2018   Anxiety 11/11/2017   Major depressive disorder with single episode, in full remission ([place]) 11/11/2017   Primary osteoarthritis involving multiple joints 11/11/2017   Diabetes mellitus due to underlying condition with unspecified complications ([place]) 74/94/4967   Change in bowel habits 06/20/2016   Risk for falls 01/23/2016   Type 2 diabetes mellitus with hyperglycemia, without long-term current use of insulin ([place]) 11/21/2015   CAD (coronary artery disease) 09/30/2015   Essential hypertension 08/09/2014   Hyperlipemia 08/09/2014   Vitamin D deficiency 08/09/2014   Sigmoid diverticulosis 07/08/2014   Encounter for colonoscopy due to history of adenomatous colonic polyps 05/20/2014   Benign prostatic hyperplasia 04/13/2014    Immunization History  Administered Date(s) Administered   Fluad Quad(high Dose 65+) 08/09/2020   Influenza Inj Mdck Quad Pf 11/11/2017, 08/07/2018   Influenza Split 10/04/2003, 08/07/2012   Influenza, High Dose Seasonal PF 09/17/2021   Influenza, Seasonal, Injecte, Preservative Fre 10/04/2003, 08/07/2012, 08/06/2013, 08/09/2014, 07/28/2015, 10/02/2016   Influenza,inj,Quad PF,6+ Mos 07/28/2015, 10/02/2016, 08/05/2019   Influenza,inj,quad, With Preservative 08/06/2013, 08/09/2014   Influenza-Unspecified 10/04/2003, 08/07/2012, 08/06/2013, 08/09/2014, 07/28/2015, 10/02/2016, 11/11/2017, 08/07/2018   PFIZER(Purple Top)SARS-COV-2 Vaccination 06/22/2020, 07/18/2020   PNEUMOCOCCAL CONJUGATE-20 10/02/2021   Pneumococcal Polysaccharide-23 04/21/2015, 04/21/2015, 08/09/2020   Pneumococcal-Unspecified 04/21/2015   Td 07/05/2003,  08/05/2019   Tdap 07/05/2003, 07/05/2003    Conditions to be addressed/monitored: CAD, HTN, HLD, DMII, Anxiety, Depression, BPH, Osteoarthritis, and insomnia  Care Plan : General Pharmacy (Adult)  Updates made by Cherre Robins, RPH-CPP since 07/22/2022 12:00 AM     Problem: Chronic Conditions: type 2 DM; HTN; HDL; GERD; insomnia; CAD;      Long-Range Goal: Provide education, support and care coordination for medication therapy and chronic conditions   Start Date: 01/19/2022  Priority: High  Note:   Current Barriers:  Unable to independently afford treatment regimen Unable to independently monitor therapeutic efficacy Unable to achieve control of type 2 diabetes   Pharmacist Clinical Goal(s):  Over the next 90 days, patient will verbalize ability to afford treatment regimen achieve adherence to monitoring guidelines and medication adherence to achieve therapeutic efficacy achieve control of type 2 diabetes as evidenced by A1c < 7.0 maintain control of hyperlipidemia as evidenced by LDL < 70  through collaboration with PharmD and provider.   Interventions: 1:1 collaboration with Shelda Pal, DO regarding development and update of comprehensive plan of care as evidenced by provider attestation and co-signature Inter-disciplinary care team collaboration (see longitudinal plan of care) Comprehensive medication review performed; medication list updated in electronic medical record  Diabetes: Uncontrolled; A1c was 8.4% 06/26/2022 at Dr Valentina Lucks office ([place] [facility])  Current treatment: Repaglinide 0.48m 3 times a day  Metformin 1009m- take 1 tablet twice a day Current glucose readings: 128 - 150 FBG in morning; once checked after a meal and was 170l Intervention:  Reviewed home blood glucose readings and reviewed goals  Fasting blood glucose goal (before meals) = 80 to 130 Blood glucose goal after a meal = less than 180  Educated on A1c goals and other possible  therapies to lower blood glucose if increase in pioglitazone does not get A1c / blood glucose to goal Recommended continue current therapy. Reminded to monitor of increased swelling with increase in pioglitazone dose (has experienced hand swelling in the past) Collaborated with pharmacy to updated prescriptions.  Will consult with PCP regarding trying FaWilder Gladehas not tried in past due to cost and now he could get [place]). Reviewed how to Treat a Low Glucose Level:  If you have a low blood glucose less than 70, please eat / drink 15 grams of carbohydrates (4 oz of juice, soda, 4 glucose tablets, or 3-4 pieces of hard candy).  It is best so choose a "quick" source of sugar that does no contain fat (chocolate and peanut butter might take longer to increase your blood glucose) Wait 15 minutes and then recheck your blood glucose. If your blood glucose is still less than 70, eat another 15 grams of carbohydrates.  Wait another 15 minutes and recheck your glucose.  Continue this until your blood glucose is over 70. Once you blood glucose is over 70, eat a snack with protein in it to prevent your blood glucose from dropping again.   Hypertension: Controlled; blood pressure goal < 130/80 ASCVD 10 year risk = 28.8% current treatment: Metoprolol ER 61m daily Lisinopril 517mdaily  BP Readings from Last 3 Encounters:  01/30/22 118/60  01/02/22 118/72  12/14/21 124/64  Current home readings: not checking Denies hypotensive/hypertensive symptoms Interventions:  Educated on importance in blood pressure control to prevent stroke and kidney disease Recommended continue current therapy  Hyperlipidemia / CAD: Controlled; Goal LDL < 70, triglycerides < 150 Current treatment: Atorvastatin 801maily  Aspirin 71m68mily  Brilinta 90mg65mce a day Medications previously tried: none Intervention:  Educated on lipid goals Recommended Continue current therapy Assisted  [place] 07/22/2022 - approved thru 11/04/2022  Medication management Pharmacist Clinical Goal(s): Over the next 90 days, patient will work with PharmD and providers to maintain optimal medication adherence Assessed for LIS / Extra help with Medicare and patient is about $500 above limit for partial help. (Early 2023)  Current pharmacy: [facility] Interventions Comprehensive medication review performed. Reviewed refill history and assessed adherence Continue current medication management strategy Coordinated with pharmacy to refill needed medications - citalopram, levocitirizine, lisinopril, metformin, metoprolol succinate, montelukast, pantopraole  Patient Goals/Self-Care Activities Over the next 90 days, patient will:  take medications as prescribed You should received a shipment of Brilinta around 07/27/2022.  focus on medication adherence by using weekly pill reminder and filling prescription on time check glucose daily document, and provide at future appointments.   collaborate with provider on medication access solutions and changes in medications How to Treat a Low Glucose Level:  If you have a low blood glucose less than 70, please eat / drink 15 grams of carbohydrates (4 oz of juice, soda, 4 glucose tablets, or 3-4 pieces of hard candy).  It is best so choose a "quick" source of sugar that does no contain fat (chocolate and peanut butter might take longer to increase your blood glucose) Wait 15 minutes and then recheck your blood glucose. If your blood glucose is still less than 70, eat another 15 grams of carbohydrates.  Wait another 15 minutes and recheck your glucose.  Continue this until your blood glucose is over 70. Once you blood glucose is over 70, eat a snack with protein in it to prevent your blood glucose from dropping again.   Follow Up Plan: Telephone follow up appointment with care management team  member scheduled for:  2 to 4 weeks     Medication Assistance:  Assessed for LIS - did not qualify for either full or partial benefits;   [place] 07/18/2022 to 11/04/2022  Patient's  preferred pharmacy is:  [facility] ([place]) [place], [place] [address] Phone: [phone] Fax: [phone]  [id number] - [place] [place] [address] [place] [address] Phone: [phone] Fax: [place], [place] [address] Phone: [phone] Fax: [phone]   Follow Up:  Patient agrees to Care Plan and Follow-up.  Plan: Telephone follow up appointment with care management team member scheduled for:  2 to 4 weeks  Cherre Robins, PharmD Clinical Pharmacist [facility] [place] [facility]

## 2022-07-22 NOTE — Patient Instructions (Signed)
Mr. Shafer It was a pleasure speaking with you today.  Below is a summary of your health goals and summary of our recent visit. You can also view your updated Chronic Care Management Care plan through your MyChart account.    Patient Goals/Self-Care Activities Over the next 90 days, patient will:  take medications as prescribed You should received a shipment of Brilinta around 07/27/2022.  focus on medication adherence by using weekly pill reminder and filling prescription on time check glucose daily document, and provide at future appointments.   collaborate with provider on medication access solutions and changes in medications How to Treat a Low Glucose Level:  If you have a low blood glucose less than 70, please eat / drink 15 grams of carbohydrates (4 oz of juice, soda, 4 glucose tablets, or 3-4 pieces of hard candy).  It is best so choose a "quick" source of sugar that does no contain fat (chocolate and peanut butter might take longer to increase your blood glucose) Wait 15 minutes and then recheck your blood glucose. If your blood glucose is still less than 70, eat another 15 grams of carbohydrates.  Wait another 15 minutes and recheck your glucose.  Continue this until your blood glucose is over 70. Once you blood glucose is over 70, eat a snack with protein in it to prevent your blood glucose from dropping again.   Follow Up Plan: Telephone follow up appointment with care management team member scheduled for:  2 to 4 weeks   As always if you have any questions or concerns especially regarding medications, please feel free to contact me either at the phone number below or with a MyChart message.   Keep up the good work!  Cherre Robins, PharmD Clinical Pharmacist [place] [phone] (direct line)  [phone] (main office number)   The patient verbalized understanding of instructions, educational materials, and care plan provided today  and agreed to receive a mailed copy of patient instructions, educational materials, and care plan.

## 2022-07-25 DIAGNOSIS — I2510 Atherosclerotic heart disease of native coronary artery without angina pectoris: Secondary | ICD-10-CM | POA: Diagnosis not present

## 2022-07-25 DIAGNOSIS — E7800 Pure hypercholesterolemia, unspecified: Secondary | ICD-10-CM | POA: Diagnosis not present

## 2022-07-25 DIAGNOSIS — I10 Essential (primary) hypertension: Secondary | ICD-10-CM | POA: Diagnosis not present

## 2022-07-25 DIAGNOSIS — Z955 Presence of coronary angioplasty implant and graft: Secondary | ICD-10-CM | POA: Diagnosis not present

## 2022-08-01 ENCOUNTER — Telehealth: Payer: Self-pay | Admitting: Family Medicine

## 2022-08-01 DIAGNOSIS — L821 Other seborrheic keratosis: Secondary | ICD-10-CM | POA: Diagnosis not present

## 2022-08-01 DIAGNOSIS — R21 Rash and other nonspecific skin eruption: Secondary | ICD-10-CM | POA: Diagnosis not present

## 2022-08-01 DIAGNOSIS — D2371 Other benign neoplasm of skin of right lower limb, including hip: Secondary | ICD-10-CM | POA: Diagnosis not present

## 2022-08-01 DIAGNOSIS — D225 Melanocytic nevi of trunk: Secondary | ICD-10-CM | POA: Diagnosis not present

## 2022-08-01 DIAGNOSIS — L986 Other infiltrative disorders of the skin and subcutaneous tissue: Secondary | ICD-10-CM | POA: Diagnosis not present

## 2022-08-01 NOTE — Telephone Encounter (Signed)
Pt's wife stated they had discussed starting farxiga with Tammy but have no received the medication and would like to know if pcp wanted to call that in. Please advise.

## 2022-08-02 MED ORDER — DAPAGLIFLOZIN PROPANEDIOL 10 MG PO TABS: 10.0000 mg | ORAL_TABLET | Freq: Every day | ORAL | 2 refills | Status: DC

## 2022-08-02 MED ORDER — DAPAGLIFLOZIN PROPANEDIOL 10 MG PO TABS: 10.0000 mg | ORAL_TABLET | Freq: Every day | ORAL | 3 refills | Status: DC

## 2022-08-02 NOTE — Telephone Encounter (Signed)
I sent it, not sure what else needs to be done so it is affordable. Ty.

## 2022-08-02 NOTE — Telephone Encounter (Signed)
Applied for assistance thru [place] and [place] for Wilder Glade- patient was approved. Should receive first shipment in about 7 to 10 business days.  Called [facility] and provided 30 day free coupon for first fill of [place]. Pharmacist states they are out of stock for Samule Ohm but will have tomorrow. Patient notified.

## 2022-08-03 NOTE — Telephone Encounter (Signed)
Just an FYI

## 2022-08-04 DIAGNOSIS — E785 Hyperlipidemia, unspecified: Secondary | ICD-10-CM | POA: Diagnosis not present

## 2022-08-04 DIAGNOSIS — I2510 Atherosclerotic heart disease of native coronary artery without angina pectoris: Secondary | ICD-10-CM

## 2022-08-04 DIAGNOSIS — I119 Hypertensive heart disease without heart failure: Secondary | ICD-10-CM | POA: Diagnosis not present

## 2022-08-04 DIAGNOSIS — Z7984 Long term (current) use of oral hypoglycemic drugs: Secondary | ICD-10-CM | POA: Diagnosis not present

## 2022-08-04 DIAGNOSIS — E1159 Type 2 diabetes mellitus with other circulatory complications: Secondary | ICD-10-CM

## 2022-08-06 DIAGNOSIS — R972 Elevated prostate specific antigen [PSA]: Secondary | ICD-10-CM | POA: Diagnosis not present

## 2022-08-06 DIAGNOSIS — N320 Bladder-neck obstruction: Secondary | ICD-10-CM | POA: Diagnosis not present

## 2022-08-06 DIAGNOSIS — R3989 Other symptoms and signs involving the genitourinary system: Secondary | ICD-10-CM | POA: Diagnosis not present

## 2022-08-08 ENCOUNTER — Ambulatory Visit: Payer: Medicare Other | Admitting: Family Medicine

## 2022-08-13 ENCOUNTER — Telehealth: Payer: Medicare Other

## 2022-08-22 ENCOUNTER — Other Ambulatory Visit: Payer: Self-pay | Admitting: Family Medicine

## 2022-08-22 ENCOUNTER — Telehealth (HOSPITAL_BASED_OUTPATIENT_CLINIC_OR_DEPARTMENT_OTHER): Payer: Self-pay

## 2022-08-22 DIAGNOSIS — I714 Abdominal aortic aneurysm, without rupture, unspecified: Secondary | ICD-10-CM

## 2022-09-17 ENCOUNTER — Telehealth: Payer: Self-pay | Admitting: *Deleted

## 2022-09-17 NOTE — Telephone Encounter (Signed)
LMOM for pt to return call to schedule AWV.

## 2022-09-18 ENCOUNTER — Telehealth: Payer: Self-pay | Admitting: Family Medicine

## 2022-09-18 NOTE — Telephone Encounter (Signed)
Copied from [place] [id number]. Topic: Medicare AWV >> Sep 18, 2022  9:49 AM Gillis Santa wrote: Reason for CRM: LVM FOR PATIENT TO CALL [phone] TO SCHEDULE AWV [place]

## 2022-09-19 ENCOUNTER — Telehealth (HOSPITAL_BASED_OUTPATIENT_CLINIC_OR_DEPARTMENT_OTHER): Payer: Self-pay

## 2022-09-20 ENCOUNTER — Telehealth: Payer: Self-pay

## 2022-09-20 NOTE — Telephone Encounter (Signed)
Mailed [place] renewal application to patient home.   Please have office staff fax to [phone] ATTENTION Sena Hoopingarner. Patient will be returning to office.   Lilianah Buffin L. CPhT Rx Patient Advocate 

## 2022-10-03 ENCOUNTER — Telehealth: Payer: Self-pay | Admitting: Pharmacist

## 2022-10-03 MED ORDER — BRILINTA 90 MG PO TABS: 90.0000 mg | ORAL_TABLET | Freq: Two times a day (BID) | ORAL | 1 refills | Status: DC

## 2022-10-03 NOTE — Telephone Encounter (Signed)
Patient's wife called with questions about what type of income information needed for [place] and [place] and [place] renewal. There is a box patient can check for a soft credit check so they do not need to submit income data.  Called [place] and [place] and he actually has been re-enrolled. Info below is from [place] and Me patient portal.    Also sent in updated Rx for Brillinta since he has run out of refills. Patient will be due to receive next delivery around 10/24/2022 - Per [facility] they are working on both the [place] prescriptions now and should be shipped next week.  Patient confirmed he has 1 full bottle / 30 day supply of each medication on hand.

## 2022-10-09 ENCOUNTER — Ambulatory Visit (INDEPENDENT_AMBULATORY_CARE_PROVIDER_SITE_OTHER): Payer: Medicare Other | Admitting: Family Medicine

## 2022-10-09 ENCOUNTER — Encounter: Payer: Self-pay | Admitting: Family Medicine

## 2022-10-09 VITALS — BP 110/64 | HR 73 | Temp 97.4°F | Ht 71.0 in | Wt 194.5 lb

## 2022-10-09 DIAGNOSIS — R413 Other amnesia: Secondary | ICD-10-CM | POA: Diagnosis not present

## 2022-10-09 DIAGNOSIS — Z0000 Encounter for general adult medical examination without abnormal findings: Secondary | ICD-10-CM | POA: Diagnosis not present

## 2022-10-09 DIAGNOSIS — J302 Other seasonal allergic rhinitis: Secondary | ICD-10-CM

## 2022-10-09 DIAGNOSIS — E088 Diabetes mellitus due to underlying condition with unspecified complications: Secondary | ICD-10-CM

## 2022-10-09 DIAGNOSIS — Z125 Encounter for screening for malignant neoplasm of prostate: Secondary | ICD-10-CM

## 2022-10-09 DIAGNOSIS — F411 Generalized anxiety disorder: Secondary | ICD-10-CM

## 2022-10-09 DIAGNOSIS — E1165 Type 2 diabetes mellitus with hyperglycemia: Secondary | ICD-10-CM

## 2022-10-09 LAB — COMPREHENSIVE METABOLIC PANEL
ALT: 22 U/L (ref 0–53)
AST: 14 U/L (ref 0–37)
Albumin: 4.5 g/dL (ref 3.5–5.2)
Alkaline Phosphatase: 104 U/L (ref 39–117)
BUN: 13 mg/dL (ref 6–23)
CO2: 26 mEq/L (ref 19–32)
Calcium: 9 mg/dL (ref 8.4–10.5)
Chloride: 104 mEq/L (ref 96–112)
Creatinine, Ser: 0.76 mg/dL (ref 0.40–1.50)
GFR: 91.94 mL/min (ref >60.00)
Glucose, Bld: 128 mg/dL — ABNORMAL HIGH (ref 70–99)
Potassium: 4.2 mEq/L (ref 3.5–5.1)
Sodium: 142 mEq/L (ref 135–145)
Total Bilirubin: 0.8 mg/dL (ref 0.2–1.2)
Total Protein: 6.6 g/dL (ref 6.0–8.3)

## 2022-10-09 LAB — CBC
HCT: 41.4 % (ref 39.0–52.0)
Hemoglobin: 13.8 g/dL (ref 13.0–17.0)
MCHC: 33.3 g/dL (ref 30.0–36.0)
MCV: 91.4 fl (ref 78.0–100.0)
Platelets: 231 10*3/uL (ref 150.0–400.0)
RBC: 4.53 Mil/uL (ref 4.22–5.81)
RDW: 13.8 % (ref 11.5–15.5)
WBC: 6.2 10*3/uL (ref 4.0–10.5)

## 2022-10-09 LAB — LIPID PANEL
Cholesterol: 93 mg/dL (ref 0–200)
HDL: 32.2 mg/dL — ABNORMAL LOW (ref >39.00)
LDL Cholesterol: 35 mg/dL (ref 0–99)
NonHDL: 60.92
Total CHOL/HDL Ratio: 3
Triglycerides: 131 mg/dL (ref 0.0–149.0)
VLDL: 26.2 mg/dL (ref 0.0–40.0)

## 2022-10-09 LAB — TSH: TSH: 1.69 u[IU]/mL (ref 0.35–5.50)

## 2022-10-09 LAB — HEMOGLOBIN A1C: Hgb A1c MFr Bld: 7.1 % — ABNORMAL HIGH (ref 4.6–6.5)

## 2022-10-09 LAB — MICROALBUMIN / CREATININE URINE RATIO
Creatinine,U: 81.7 mg/dL
Microalb Creat Ratio: 0.9 mg/g (ref 0.0–30.0)
Microalb, Ur: <0.7 mg/dL (ref 0.0–1.9)

## 2022-10-09 LAB — VITAMIN B12: Vitamin B-12: 76 pg/mL — ABNORMAL LOW (ref 211–911)

## 2022-10-09 LAB — PSA, MEDICARE: PSA: 1.24 ng/ml (ref 0.10–4.00)

## 2022-10-09 LAB — T4, FREE: Free T4: 0.87 ng/dL (ref 0.60–1.60)

## 2022-10-09 MED ORDER — BUSPIRONE HCL 7.5 MG PO TABS: 7.5000 mg | ORAL_TABLET | Freq: Two times a day (BID) | ORAL | 2 refills | Status: DC

## 2022-10-09 MED ORDER — PANTOPRAZOLE SODIUM 20 MG PO TBEC: 20.0000 mg | DELAYED_RELEASE_TABLET | Freq: Every day | ORAL | 3 refills | Status: DC

## 2022-10-09 MED ORDER — ATORVASTATIN CALCIUM 80 MG PO TABS: 80.0000 mg | ORAL_TABLET | Freq: Every day | ORAL | 3 refills | Status: DC

## 2022-10-09 MED ORDER — CITALOPRAM HYDROBROMIDE 40 MG PO TABS: 40.0000 mg | ORAL_TABLET | Freq: Every day | ORAL | 3 refills | Status: DC

## 2022-10-09 MED ORDER — METOPROLOL SUCCINATE ER 50 MG PO TB24: 50.0000 mg | ORAL_TABLET | Freq: Every day | ORAL | 3 refills | Status: DC

## 2022-10-09 MED ORDER — REPAGLINIDE 1 MG PO TABS: ORAL_TABLET | ORAL | 3 refills | Status: DC

## 2022-10-09 MED ORDER — LEVOCETIRIZINE DIHYDROCHLORIDE 5 MG PO TABS: 5.0000 mg | ORAL_TABLET | Freq: Every evening | ORAL | 3 refills | Status: DC

## 2022-10-09 MED ORDER — LISINOPRIL 5 MG PO TABS: 5.0000 mg | ORAL_TABLET | Freq: Every day | ORAL | 3 refills | Status: DC

## 2022-10-09 MED ORDER — MONTELUKAST SODIUM 10 MG PO TABS: 10.0000 mg | ORAL_TABLET | Freq: Every day | ORAL | 3 refills | Status: DC

## 2022-10-09 MED ORDER — CLONAZEPAM 0.5 MG PO TABS: 0.2500 mg | ORAL_TABLET | Freq: Two times a day (BID) | ORAL | 0 refills | Status: DC | PRN

## 2022-10-09 NOTE — Patient Instructions (Addendum)
Give [place] 2-3 business days to get the results of your labs back.   Keep the diet clean and stay active.  Please get me a copy of your advanced directive form at your convenience.   Let us know if you need anything.  Coping skills Choose 5 that work for you: Take a deep breath Count to 20 Read a book Do a puzzle Meditate Bake Sing Knit Garden Pray Go outside Call a friend Listen to music Take a walk Color Send a note Take a bath Watch a movie Be alone in a quiet place Pet an animal Visit a friend Journal Exercise Stretch

## 2022-10-09 NOTE — Progress Notes (Signed)
Chief Complaint  Patient presents with   Annual Exam    Well Male Gary Barnett is here for a complete physical.   His last physical was >1 year ago.  Current diet: in general, a "healthy" diet.   Current exercise: cardio, lifting wts Weight trend: stable Fatigue out of ordinary? No. Seat belt? Yes.   Advanced directive? Yes  Health maintenance Shingrix- Yes; due for 2nd next mo Colonoscopy- Yes Tetanus- Yes Hep C- Yes Pneumonia vaccine- Yes  GAD Taking CElexa 40 mg/d, compliant, no AE's. Having anxiety and poor sleep. Not following with a counselor. No SI or HI. No self medication. Was on Klonopin in the past and did well. Interested in going on it again.   Memory Over the past year, pt has had worsening memory/recall per his wife. He has not noticed anything.   Past Medical History:  Diagnosis Date   Anxiety 11/11/2017   Benign prostatic hyperplasia 04/13/2014   Formatting of this note might be different from the original. 10/1 IMO update   CAD (coronary artery disease) 09/30/2015   Change in bowel habits 06/20/2016   Diabetes mellitus due to underlying condition with unspecified complications ([place]) 5/63/8937   Diabetes mellitus without complication ([place])    Encounter for colonoscopy due to history of adenomatous colonic polyps 05/20/2014   Essential hypertension 08/09/2014   GAD (generalized anxiety disorder) 05/04/2020   Hyperlipemia 08/09/2014   Hyperlipidemia    Hypertension    Insomnia 06/03/2019   Major depressive disorder with single episode, in full remission ([place]) 11/11/2017   Malaise 03/20/2018   Primary osteoarthritis involving multiple joints 11/11/2017   Risk for falls 01/23/2016   Screening for malignant neoplasm of prostate 03/20/2018   Seasonal allergies 08/03/2021   Sigmoid diverticulosis 07/08/2014   Type 2 diabetes mellitus with hyperglycemia, without long-term current use of insulin ([place]) 11/21/2015   Vitamin D deficiency 08/09/2014     Past Surgical History:   Procedure Laterality Date   CORONARY STENT PLACEMENT     Thinks left main, around age 88    Medications  Current Outpatient Medications on File Prior to Visit  Medication Sig Dispense Refill   Accu-Chek FastClix Lancets MISC Use daily to check blood sugar three times daily.  DXE11.9 306 each 3   aspirin EC 81 MG tablet Take 1 tablet (81 mg total) by mouth daily. Swallow whole. 90 tablet 3   atorvastatin (LIPITOR) 80 MG tablet Take 1 tablet (80 mg total) by mouth daily. 90 tablet 2   BRILINTA 90 MG TABS tablet Take 1 tablet (90 mg total) by mouth 2 (two) times daily. 180 tablet 1   citalopram (CELEXA) 40 MG tablet Take 1 tablet (40 mg total) by mouth daily. 90 tablet 0   dapagliflozin propanediol (FARXIGA) 10 MG TABS tablet Take 1 tablet (10 mg total) by mouth daily before breakfast. 90 tablet 3   glucose blood (ACCU-CHEK AVIVA PLUS) test strip Use three days to check blood sugar.  DX E11.9 300 each 3   ipratropium (ATROVENT) 0.03 % nasal spray Place 2 sprays into both nostrils 3 (three) times daily as needed for rhinitis. (Patient not taking: Reported on 07/18/2022) 30 mL 5   levocetirizine (XYZAL) 5 MG tablet Take 1 tablet (5 mg total) by mouth every evening. 90 tablet 0   lisinopril (ZESTRIL) 5 MG tablet Take 1 tablet (5 mg total) by mouth daily. 90 tablet 0   metFORMIN (GLUCOPHAGE) 1000 MG tablet Take 1 tablet (1,000 mg total) by  mouth 2 (two) times daily with a meal. 180 tablet 2   metoprolol succinate (TOPROL-XL) 50 MG 24 hr tablet Take 1 tablet (50 mg total) by mouth daily. Take with or immediately following a meal. 90 tablet 0   montelukast (SINGULAIR) 10 MG tablet Take 1 tablet (10 mg total) by mouth at bedtime. 90 tablet 0   nitroGLYCERIN (NITROSTAT) 0.4 MG SL tablet Place 1 tablet (0.4 mg total) under the tongue every 5 (five) minutes x 3 doses as needed for chest pain. 25 tablet 1   pantoprazole (PROTONIX) 20 MG tablet Take 1 tablet (20 mg total) by mouth daily. 90 tablet 0    repaglinide (PRANDIN) 1 MG tablet TAKE 1 TABLET(1 MG) BY MOUTH THREE TIMES DAILY BEFORE MEALS 270 tablet 0   vitamin C (ASCORBIC ACID) 500 MG tablet Take 500 mg by mouth 2 (two) times daily.     Zinc 50 MG TABS Take 1 tablet by mouth daily.      Allergies Allergies  Allergen Reactions   Clopidogrel Hives and Itching    Family History Family History  Problem Relation Age of Onset   Heart attack Brother    Heart disease Brother    Hyperlipidemia Mother    Hypertension Father     Review of Systems: Constitutional:  no fevers Eye:  no recent significant change in vision Ears:  No changes in hearing Nose/Mouth/Throat:  no complaints of nasal congestion, no sore throat Cardiovascular: no chest pain Respiratory:  No shortness of breath Gastrointestinal:  No change in bowel habits GU:  No frequency Integumentary:  no abnormal skin lesions reported Neurologic:  no headaches Endocrine:  denies unexplained weight changes  Exam BP 110/64 (BP Location: Left Arm, Patient Position: Sitting, Cuff Size: Normal)   Pulse 73   Temp (!) 97.4 F (36.3 C) (Oral)   Ht '5\' 11"'$  (1.803 m)   Wt 194 lb 8 oz (88.2 kg)   SpO2 98%   BMI 27.13 kg/m  General:  well developed, well nourished, in no apparent distress Skin:  no significant moles, warts, or growths Head:  no masses, lesions, or tenderness Eyes:  pupils equal and round, sclera anicteric without injection Ears:  canals without lesions, TMs shiny without retraction, no obvious effusion, no erythema Nose:  nares patent, mucosa normal Throat/Pharynx:  lips and gingiva without lesion; tongue and uvula midline; non-inflamed pharynx; no exudates or postnasal drainage Lungs:  clear to auscultation, breath sounds equal bilaterally, no respiratory distress Cardio:  regular rate and rhythm, no LE edema or bruits Rectal: Deferred GI: BS+, S, NT, ND, no masses or organomegaly Musculoskeletal:  symmetrical muscle groups noted without atrophy or  deformity Neuro:  gait normal; deep tendon reflexes normal and symmetric, A&O x 4 Psych: well oriented with normal range of affect and appropriate judgment/insight  Assessment and Plan  Well adult exam  Diabetes mellitus due to underlying condition with unspecified complications ([place]) - Plan: CBC, Comprehensive metabolic panel, Lipid panel, Hemoglobin A1c, Microalbumin / creatinine urine ratio  Memory deficit - Plan: TSH, T4, free, B12  Screening for prostate cancer - Plan: PSA, Medicare ( [place] only)  GAD (generalized anxiety disorder) - Plan: citalopram (CELEXA) 40 MG tablet, busPIRone (BUSPAR) 7.5 MG tablet, clonazePAM (KLONOPIN) 0.5 MG tablet  Type 2 diabetes mellitus with hyperglycemia, without long-term current use of insulin (H[facility]) - Plan: repaglinide (PRANDIN) 1 MG tablet, atorvastatin (LIPITOR) 80 MG tablet  Seasonal allergies - Plan: montelukast (SINGULAIR) 10 MG tablet   Well  71 y.o. male. Counseled on diet and exercise. Memory issues: Chronic, uncontrolled. Will ck Thyroid levels and B12 in addition to CPE labs. If neg, will ck MRI brain and consider neuropsych testing. GAD: Chronic, uncontrolled. Politely declined counseling. Anxiety coping techniques provided in AVS. Will cont Celexa 40 mg/d, add BuSpar 7.5 mg bid, klonopin 0.25-0.5 mg bid prn. F/u in 1 mo. Advanced directive form requested today.  Other orders as above. The patient and his wife voiced understanding and agreement to the plan.  [place], DO 10/09/22 10:51 AM

## 2022-10-10 ENCOUNTER — Other Ambulatory Visit: Payer: Medicare Other

## 2022-10-10 ENCOUNTER — Encounter: Payer: Self-pay | Admitting: Family Medicine

## 2022-10-10 ENCOUNTER — Other Ambulatory Visit: Payer: Self-pay | Admitting: Family Medicine

## 2022-10-10 DIAGNOSIS — R413 Other amnesia: Secondary | ICD-10-CM | POA: Diagnosis not present

## 2022-10-10 DIAGNOSIS — Z0000 Encounter for general adult medical examination without abnormal findings: Secondary | ICD-10-CM

## 2022-10-12 LAB — INTRINSIC FACTOR ANTIBODIES: Intrinsic Factor: NEGATIVE

## 2022-10-17 NOTE — Telephone Encounter (Signed)
Patient stopped Buspar --- caused light headedness, fatigue and anxiety worse. He will just keep taking the Celexa and Klonopin as needed. Has not scheduled a 1 month follow-up yet.

## 2022-10-19 ENCOUNTER — Telehealth: Payer: Self-pay | Admitting: Pharmacist

## 2022-10-19 NOTE — Telephone Encounter (Signed)
Patient's wife calls with questions regarding Brillenta. He received 90 days from [facility] and Me program - was shipped 07/25/2022 but has not received another delivery yet. Patient state she tried to call [place] and Me but she was unable to reach anyone.  [place] [facility] [place] program is closed until 1pm today for employee training.  I was able to use their automated system to verify last refill ship date was 07/25/2022 for 90 days = 180 tablets. Could refill as soon as 09/17/2022.   Advised patient's wife will call back after 1pm to request refill for Brillenta from [place] and Me.

## 2022-10-23 ENCOUNTER — Ambulatory Visit (INDEPENDENT_AMBULATORY_CARE_PROVIDER_SITE_OTHER): Payer: Medicare Other | Admitting: Pharmacist

## 2022-10-23 DIAGNOSIS — Z79899 Other long term (current) drug therapy: Secondary | ICD-10-CM

## 2022-10-23 NOTE — Progress Notes (Signed)
Pharmacy Note  10/23/2022 Name: Gary Barnett MRN: [id number] DOB: 09-Oct-1951  Subjective: Gary Barnett is a 71 y.o. year old male who is a primary care patient of Shelda Pal, DO. Clinical Pharmacist Practitioner referral was placed to assist with medication management.    Engaged with patient by telephone for follow up visit today.  Patient's wife was approved for Medicare Extra Help / LIS earlier in December 2023. At the time [name] did not want to apply for LIS but today he would like to see if he too would qualify for LIS.   Objective: Review of patient status, including review of consultants reports, laboratory and other test data, was performed as part of comprehensive.  Lab Results  Component Value Date   CREATININE 0.76 10/09/2022   CREATININE 0.88 04/03/2022   CREATININE 0.96 01/30/2022    Lab Results  Component Value Date   HGBA1C 7.1 (H) 10/09/2022       Component Value Date/Time   CHOL 93 10/09/2022 1105   CHOL 149 02/16/2020 1139   TRIG 131.0 10/09/2022 1105   HDL 32.20 (L) 10/09/2022 1105   HDL 30 (L) 02/16/2020 1139   CHOLHDL 3 10/09/2022 1105   VLDL 26.2 10/09/2022 1105   LDLCALC 35 10/09/2022 1105   LDLCALC 40 08/09/2020 1359     Clinical ASCVD: Yes  The ASCVD Risk score (Arnett DK, et al., 2019) failed to calculate for the following reasons:   The patient has a prior MI or stroke diagnosis    BP Readings from Last 3 Encounters:  10/09/22 110/64  04/26/22 98/60  04/03/22 120/68     Allergies  Allergen Reactions   Clopidogrel Hives and Itching    Medications Reviewed Today     Reviewed by Cherre Robins, RPH-CPP (Pharmacist) on 10/23/22 at 1532  Med List Status: <None>   Medication Order Taking? Sig Documenting Provider Last Dose Status Informant  Accu-Chek FastClix Lancets MISC [id number] Yes Use daily to check blood sugar three times daily.  DXE11.9 Shelda Pal, DO Taking Active   aspirin EC 81 MG  tablet [id number] Yes Take 1 tablet (81 mg total) by mouth daily. Swallow whole. Revankar, Reita Cliche, MD Taking Active   atorvastatin (LIPITOR) 80 MG tablet [id number] Yes Take 1 tablet (80 mg total) by mouth daily. Shelda Pal, DO Taking Active   BRILINTA 90 MG TABS tablet [id number] Yes Take 1 tablet (90 mg total) by mouth 2 (two) times daily. Shelda Pal, DO Taking Active   busPIRone (BUSPAR) 7.5 MG tablet [id number] Yes Take 1 tablet (7.5 mg total) by mouth 2 (two) times daily. Shelda Pal, DO Taking Active   citalopram (CELEXA) 40 MG tablet [id number] Yes Take 1 tablet (40 mg total) by mouth daily. Shelda Pal, DO Taking Active   clonazePAM (KLONOPIN) 0.5 MG tablet [id number] Yes Take 0.5-1 tablets (0.25-0.5 mg total) by mouth 2 (two) times daily as needed for anxiety. Shelda Pal, DO Taking Active   dapagliflozin propanediol (FARXIGA) 10 MG TABS tablet [id number] Yes Take 1 tablet (10 mg total) by mouth daily before breakfast. Shelda Pal, DO Taking Active   glucose blood (ACCU-CHEK AVIVA PLUS) test strip [id number] Yes Use three days to check blood sugar.  DX E11.9 Shelda Pal, DO Taking Active   ipratropium (ATROVENT) 0.03 % nasal spray [id number] Yes Place 2 sprays into both nostrils 3 (three) times daily as needed for rhinitis. Clemon Chambers, MD Taking Active  levocetirizine (XYZAL) 5 MG tablet [id number] Yes Take 1 tablet (5 mg total) by mouth every evening. Shelda Pal, DO Taking Active   lisinopril (ZESTRIL) 5 MG tablet [id number] Yes Take 1 tablet (5 mg total) by mouth daily. Shelda Pal, DO Taking Active   metFORMIN (GLUCOPHAGE) 1000 MG tablet [id number] Yes Take 1 tablet (1,000 mg total) by mouth 2 (two) times daily with a meal. Shelda Pal, DO Taking Active   metoprolol succinate (TOPROL-XL) 50 MG 24 hr tablet [id number] Yes Take 1 tablet (50 mg total) by mouth daily. Take with or  immediately following a meal. Shelda Pal, DO Taking Active   montelukast (SINGULAIR) 10 MG tablet [id number] Yes Take 1 tablet (10 mg total) by mouth at bedtime. Shelda Pal, DO Taking Active   nitroGLYCERIN (NITROSTAT) 0.4 MG SL tablet [id number] Yes Place 1 tablet (0.4 mg total) under the tongue every 5 (five) minutes x 3 doses as needed for chest pain. Shelda Pal, DO Taking Active   pantoprazole (PROTONIX) 20 MG tablet [id number] Yes Take 1 tablet (20 mg total) by mouth daily. Shelda Pal, DO Taking Active   repaglinide (PRANDIN) 1 MG tablet [id number] Yes TAKE 1 TABLET(1 MG) BY MOUTH THREE TIMES DAILY BEFORE MEALS Wendling, Crosby Oyster, DO Taking Active   vitamin C (ASCORBIC ACID) 500 MG tablet [id number] Yes Take 500 mg by mouth 2 (two) times daily. [provider] Taking Active   Zinc 50 MG TABS [id number] Yes Take 1 tablet by mouth daily. [provider] Taking Active             Patient Active Problem List   Diagnosis Date Noted   Hyperlipidemia 12/08/2021   Hypertension 12/08/2021   Seasonal allergies 08/03/2021   GAD (generalized anxiety disorder) 05/04/2020   Insomnia 06/03/2019   Diabetes mellitus without complication ([place]) 09/47/0962   Malaise 03/20/2018   Screening for malignant neoplasm of prostate 03/20/2018   Anxiety 11/11/2017   Major depressive disorder with single episode, in full remission ([place]) 11/11/2017   Primary osteoarthritis involving multiple joints 11/11/2017   Diabetes mellitus due to underlying condition with unspecified complications ([employer]) 83/66/2947   Change in bowel habits 06/20/2016   Risk for falls 01/23/2016   Type 2 diabetes mellitus with hyperglycemia, without long-term current use of insulin ([place]) 11/21/2015   CAD (coronary artery disease) 09/30/2015   Essential hypertension 08/09/2014   Hyperlipemia 08/09/2014   Vitamin D deficiency 08/09/2014   Sigmoid diverticulosis 07/08/2014    Encounter for colonoscopy due to history of adenomatous colonic polyps 05/20/2014   Benign prostatic hyperplasia 04/13/2014     Medication Assistance:   patient is getting Brilinta and  Farxiga from [facility] and Me program. Will apply for LIS as this will lower medical and medication costs. If he is approved for LIS then will notify [place] and Me as he will no longer qualify to receive meds from them.    Assessment / Plan: Medication management:  Reviewed and updated medication list Reviewed refill history and adherence Assisted patient in applying for extra assistance   Follow Up:  Telephone follow up appointment with care management team member scheduled for:  1 to 2 months.   Cherre Robins, PharmD Clinical Pharmacist [facility] [phone]

## 2022-10-31 ENCOUNTER — Telehealth: Payer: Self-pay | Admitting: Family Medicine

## 2022-10-31 NOTE — Telephone Encounter (Signed)
Pt's wife stated she feels like b-12 is not helping and he seems to be worse/more tired. She declined to see another provider sooner than next week and would rather Robin call her for the time being.

## 2022-11-01 NOTE — Telephone Encounter (Signed)
Will leave for Robin when she returns to office

## 2022-11-06 ENCOUNTER — Ambulatory Visit (INDEPENDENT_AMBULATORY_CARE_PROVIDER_SITE_OTHER): Payer: Medicare Other | Admitting: Family Medicine

## 2022-11-06 ENCOUNTER — Encounter: Payer: Self-pay | Admitting: Family Medicine

## 2022-11-06 VITALS — BP 110/64 | HR 81 | Temp 98.0°F | Ht 71.0 in | Wt 193.4 lb

## 2022-11-06 DIAGNOSIS — R5383 Other fatigue: Secondary | ICD-10-CM

## 2022-11-06 DIAGNOSIS — E559 Vitamin D deficiency, unspecified: Secondary | ICD-10-CM | POA: Diagnosis not present

## 2022-11-06 DIAGNOSIS — E538 Deficiency of other specified B group vitamins: Secondary | ICD-10-CM

## 2022-11-06 DIAGNOSIS — F411 Generalized anxiety disorder: Secondary | ICD-10-CM | POA: Diagnosis not present

## 2022-11-06 MED ORDER — MIRTAZAPINE 7.5 MG PO TABS: 7.5000 mg | ORAL_TABLET | Freq: Every day | ORAL | 1 refills | Status: DC

## 2022-11-06 MED ORDER — CLONAZEPAM 0.5 MG PO TABS: 0.2500 mg | ORAL_TABLET | Freq: Two times a day (BID) | ORAL | 2 refills | Status: DC | PRN

## 2022-11-06 NOTE — Telephone Encounter (Signed)
Called left message to call back 

## 2022-11-06 NOTE — Patient Instructions (Signed)
Only take the Klonopin as needed.  Let us know if you need anything.

## 2022-11-06 NOTE — Telephone Encounter (Signed)
Patient is scheduled today.

## 2022-11-06 NOTE — Progress Notes (Signed)
Chief Complaint  Patient presents with   Fatigue    Subjective: Patient is a 72 y.o. male here for low energy/appetite.  Here with his wife Tammy who helps with history.  Has gotten worse over the past few weeks. He did start Klonopin just prior to this starting. He does not snore at night. Anxiety is improved on the Klonopin but still bothersome.  He was unable to take BuSpar due to side effects.  He is currently compliant with Celexa 40 mg daily.  He is not following with a therapist or counselor.  Past Medical History:  Diagnosis Date   Anxiety 11/11/2017   Benign prostatic hyperplasia 04/13/2014   Formatting of this note might be different from the original. 10/1 IMO update   CAD (coronary artery disease) 09/30/2015   Change in bowel habits 06/20/2016   Diabetes mellitus due to underlying condition with unspecified complications ([place]) 8/54/6270   Diabetes mellitus without complication ([place])    Encounter for colonoscopy due to history of adenomatous colonic polyps 05/20/2014   Essential hypertension 08/09/2014   GAD (generalized anxiety disorder) 05/04/2020   Hyperlipemia 08/09/2014   Hyperlipidemia    Hypertension    Insomnia 06/03/2019   Major depressive disorder with single episode, in full remission ([place]) 11/11/2017   Malaise 03/20/2018   Primary osteoarthritis involving multiple joints 11/11/2017   Risk for falls 01/23/2016   Screening for malignant neoplasm of prostate 03/20/2018   Seasonal allergies 08/03/2021   Sigmoid diverticulosis 07/08/2014   Type 2 diabetes mellitus with hyperglycemia, without long-term current use of insulin ([facility]CC) 11/21/2015   Vitamin D deficiency 08/09/2014    Objective: BP 110/64 (BP Location: Left Arm, Patient Position: Sitting, Cuff Size: Normal)   Pulse 81   Temp 98 F (36.7 C) (Oral)   Ht '5\' 11"'$  (1.803 m)   Wt 193 lb 6 oz (87.7 kg)   SpO2 97%   BMI 26.97 kg/m  General: Awake, appears stated age Heart: RRR, no LE edema Lungs: CTAB, no rales, wheezes  or rhonchi. No accessory muscle use Psych: Normal affect and mood  Assessment and Plan: GAD (generalized anxiety disorder) - Plan: clonazePAM (KLONOPIN) 0.5 MG tablet  Fatigue, unspecified type - Plan: CBC, Comprehensive metabolic panel  J50 deficiency - Plan: B12  Vitamin D deficiency - Plan: VITAMIN D 25 Hydroxy (Vit-D Deficiency, Fractures)  Chronic, uncontrolled.  Continue Klonopin 0.25-0.5 mg daily as needed and Celexa 40 mg daily.  Add mirtazapine 7.5 mg nightly as this could help with both anxiety and his appetite.  Follow-up in 1 month. 2/3/4. Check above labs again.  Unlikely to be sleep apnea, could be related to his anxiety not being controlled. The patient and his spouse voiced understanding and agreement to the plan.  [place], DO 11/06/22  4:21 PM

## 2022-11-07 LAB — COMPREHENSIVE METABOLIC PANEL
ALT: 21 U/L (ref 0–53)
AST: 14 U/L (ref 0–37)
Albumin: 4.5 g/dL (ref 3.5–5.2)
Alkaline Phosphatase: 112 U/L (ref 39–117)
BUN: 16 mg/dL (ref 6–23)
CO2: 23 mEq/L (ref 19–32)
Calcium: 9.5 mg/dL (ref 8.4–10.5)
Chloride: 99 mEq/L (ref 96–112)
Creatinine, Ser: 0.91 mg/dL (ref 0.40–1.50)
GFR: 86.16 mL/min (ref >60.00)
Glucose, Bld: 179 mg/dL — ABNORMAL HIGH (ref 70–99)
Potassium: 4.6 mEq/L (ref 3.5–5.1)
Sodium: 138 mEq/L (ref 135–145)
Total Bilirubin: 0.9 mg/dL (ref 0.2–1.2)
Total Protein: 6.7 g/dL (ref 6.0–8.3)

## 2022-11-07 LAB — CBC
HCT: 43.3 % (ref 39.0–52.0)
Hemoglobin: 14.5 g/dL (ref 13.0–17.0)
MCHC: 33.6 g/dL (ref 30.0–36.0)
MCV: 91.4 fl (ref 78.0–100.0)
Platelets: 264 10*3/uL (ref 150.0–400.0)
RBC: 4.74 Mil/uL (ref 4.22–5.81)
RDW: 13.6 % (ref 11.5–15.5)
WBC: 9 10*3/uL (ref 4.0–10.5)

## 2022-11-07 LAB — VITAMIN B12: Vitamin B-12: 337 pg/mL (ref 211–911)

## 2022-11-07 LAB — VITAMIN D 25 HYDROXY (VIT D DEFICIENCY, FRACTURES): VITD: 60.31 ng/mL (ref 30.00–100.00)

## 2022-11-12 ENCOUNTER — Ambulatory Visit: Payer: BLUE CROSS/BLUE SHIELD | Admitting: Pharmacist

## 2022-11-12 DIAGNOSIS — F411 Generalized anxiety disorder: Secondary | ICD-10-CM

## 2022-11-12 DIAGNOSIS — E1165 Type 2 diabetes mellitus with hyperglycemia: Secondary | ICD-10-CM

## 2022-11-12 MED ORDER — ACCU-CHEK AVIVA PLUS VI STRP: ORAL_STRIP | 3 refills | Status: DC

## 2022-11-12 NOTE — Progress Notes (Signed)
Pharmacy Note  11/12/2022 Name: Gary Barnett MRN: [id number] DOB: 1951/08/29  Subjective: Gary Barnett is a 72 y.o. year old male who is a primary care patient of Gary Pal, DO. Clinical Pharmacist Practitioner referral was placed to assist with medication management.    Engaged with patient by telephone for follow up visit today.  Type 2 DM: patient is checking blood glucose 2 to 3 times per day. Reports FBG usually 90 to 105 and blood glucose after meals usually 120 to 145. Last A1c was 7.1%.  Patient is taking [place], metformin and repaglinide as prescribed.  Has not missed doses.  Patient is requesting refill on his test strip  Anxiety: Patient has started mirtazapine last week. Reports sleep and anxiety is improving. He also take citalopram '40mg'$  daily and clonazepam as needed.   Medication Management: Patient received delivery of Brilinta and Farxiga from medication assistance program at the end of December. We have applies for LIS since his wife was approved. If approved will need to notify medication assistance program as he cannot be enrolled in both programs.   Objective: Review of patient status, including review of consultants reports, laboratory and other test data, was performed as part of comprehensive.  Lab Results  Component Value Date   CREATININE 0.91 11/06/2022   CREATININE 0.76 10/09/2022   CREATININE 0.88 04/03/2022    Lab Results  Component Value Date   HGBA1C 7.1 (H) 10/09/2022       Component Value Date/Time   CHOL 93 10/09/2022 1105   CHOL 149 02/16/2020 1139   TRIG 131.0 10/09/2022 1105   HDL 32.20 (L) 10/09/2022 1105   HDL 30 (L) 02/16/2020 1139   CHOLHDL 3 10/09/2022 1105   VLDL 26.2 10/09/2022 1105   LDLCALC 35 10/09/2022 1105   LDLCALC 40 08/09/2020 1359     Clinical ASCVD: Yes  The ASCVD Risk score (Arnett DK, et al., 2019) failed to calculate for the following reasons:   The patient has a prior MI or stroke  diagnosis    BP Readings from Last 3 Encounters:  11/06/22 110/64  10/09/22 110/64  04/26/22 98/60     Allergies  Allergen Reactions   Clopidogrel Hives and Itching    Medications Reviewed Today     Reviewed by Cherre Robins, RPH-CPP (Pharmacist) on 11/12/22 at 1032  Med List Status: <None>   Medication Order Taking? Sig Documenting Provider Last Dose Status Informant  Accu-Chek FastClix Lancets MISC [id number] Yes Use daily to check blood sugar three times daily.  DXE11.9 Gary Pal, DO Taking Active   aspirin EC 81 MG tablet [id number] Yes Take 1 tablet (81 mg total) by mouth daily. Swallow whole. Revankar, Reita Cliche, MD Taking Active   atorvastatin (LIPITOR) 80 MG tablet [id number] Yes Take 1 tablet (80 mg total) by mouth daily. Gary Pal, DO Taking Active   BRILINTA 90 MG TABS tablet [id number] Yes Take 1 tablet (90 mg total) by mouth 2 (two) times daily. Gary Pal, DO Taking Active   citalopram (CELEXA) 40 MG tablet [id number] Yes Take 1 tablet (40 mg total) by mouth daily. Gary Pal, DO Taking Active   clonazePAM (KLONOPIN) 0.5 MG tablet [id number] Yes Take 0.5-1 tablets (0.25-0.5 mg total) by mouth 2 (two) times daily as needed for anxiety. Gary Pal, DO Taking Active   cyanocobalamin (VITAMIN B12) 1000 MCG tablet [id number] Yes Take 1,000 mcg by mouth daily. [provider] Taking Active   dapagliflozin propanediol (FARXIGA)  10 MG TABS tablet [id number] Yes Take 1 tablet (10 mg total) by mouth daily before breakfast. Gary Pal, DO Taking Active   glucose blood (ACCU-CHEK AVIVA PLUS) test strip [id number]  Use three days to check blood sugar.  DX E11.9 Gary Pal, DO  Active   ipratropium (ATROVENT) 0.03 % nasal spray [id number]  Place 2 sprays into both nostrils 3 (three) times daily as needed for rhinitis. Clemon Chambers, MD  Active   levocetirizine Harlow Ohms) 5 MG tablet [id number] Yes Take  1 tablet (5 mg total) by mouth every evening. Gary Pal, DO Taking Active   lisinopril (ZESTRIL) 5 MG tablet [id number] Yes Take 1 tablet (5 mg total) by mouth daily. Gary Pal, DO Taking Active   metFORMIN (GLUCOPHAGE) 1000 MG tablet [id number] Yes Take 1 tablet (1,000 mg total) by mouth 2 (two) times daily with a meal. Gary Pal, DO Taking Active   metoprolol succinate (TOPROL-XL) 50 MG 24 hr tablet [id number] Yes Take 1 tablet (50 mg total) by mouth daily. Take with or immediately following a meal. Gary Pal, DO Taking Active   mirtazapine (REMERON) 7.5 MG tablet [id number] Yes Take 1 tablet (7.5 mg total) by mouth at bedtime. Gary Pal, DO Taking Active   montelukast (SINGULAIR) 10 MG tablet [id number] Yes Take 1 tablet (10 mg total) by mouth at bedtime. Gary Pal, DO Taking Active   nitroGLYCERIN (NITROSTAT) 0.4 MG SL tablet [id number]  Place 1 tablet (0.4 mg total) under the tongue every 5 (five) minutes x 3 doses as needed for chest pain. Gary Pal, DO  Active   pantoprazole (PROTONIX) 20 MG tablet [id number] Yes Take 1 tablet (20 mg total) by mouth daily. Gary Pal, DO Taking Active   repaglinide (PRANDIN) 1 MG tablet [id number] Yes TAKE 1 TABLET(1 MG) BY MOUTH THREE TIMES DAILY BEFORE MEALS Wendling, Crosby Oyster, DO Taking Active   vitamin C (ASCORBIC ACID) 500 MG tablet [id number] Yes Take 500 mg by mouth 2 (two) times daily. [provider] Taking Active   Zinc 50 MG TABS [id number] Yes Take 1 tablet by mouth daily. [provider] Taking Active             Patient Active Problem List   Diagnosis Date Noted   Hyperlipidemia 12/08/2021   Hypertension 12/08/2021   Seasonal allergies 08/03/2021   GAD (generalized anxiety disorder) 05/04/2020   Insomnia 06/03/2019   Diabetes mellitus without complication ([place]) 99/83/3825   Malaise 03/20/2018   Screening for  malignant neoplasm of prostate 03/20/2018   Anxiety 11/11/2017   Major depressive disorder with single episode, in full remission ([place]) 11/11/2017   Primary osteoarthritis involving multiple joints 11/11/2017   Diabetes mellitus due to underlying condition with unspecified complications ([place]) 05/39/7673   Change in bowel habits 06/20/2016   Risk for falls 01/23/2016   Type 2 diabetes mellitus with hyperglycemia, without long-term current use of insulin ([place]) 11/21/2015   CAD (coronary artery disease) 09/30/2015   Essential hypertension 08/09/2014   Hyperlipemia 08/09/2014   Vitamin D deficiency 08/09/2014   Sigmoid diverticulosis 07/08/2014   Encounter for colonoscopy due to history of adenomatous colonic polyps 05/20/2014   Benign prostatic hyperplasia 04/13/2014     Medication Assistance:   patient is getting Brilinta and  Farxiga from [facility] and Me program. Applied for LIS 10/2022 but has not received letter regarding if he has been approved or denied. If he is approved for LIS then will  notify [place] as he will no longer qualify to receive meds from them.    Assessment / Plan: Type 2 DM: improved Continue metformin, Farxiga and repaglinide.  Continue to check blood glucose 2 to 3 times a day. Reviewed blood glucose goals.  Updated Rx sent to [facility] at patient request for blood glucose test strips.  Anxiety:  Continue mirtazapine, citalopram daily. Continue to use clonazepam as needed.   Medication management:  Reviewed and updated medication list Reviewed refill history and adherence [place]ified with [place] patient has been [place] thru 11/05/2023 for [place] and [place].   Meds ordered this encounter  Medications   glucose blood (ACCU-CHEK AVIVA PLUS) test strip    Sig: Use three days to check blood sugar.  DX E11.9    Dispense:  300 each    Refill:  3    Follow Up:  Telephone follow up appointment with care management team member scheduled for:  2 to 3   months.   Cherre Robins, PharmD Clinical Pharmacist [facility] [phone]

## 2022-11-20 ENCOUNTER — Telehealth: Payer: Self-pay | Admitting: Family Medicine

## 2022-11-20 NOTE — Telephone Encounter (Signed)
Patient has [employer] on 12/25/2022.  Needs a  letter to excuse due to various health issues that would make it impossible for him to do File [phone] Will call wife once completed.

## 2022-11-20 NOTE — Telephone Encounter (Signed)
Called left message to call back 

## 2022-11-20 NOTE — Telephone Encounter (Signed)
Patients wife informed letter done.

## 2022-11-20 NOTE — Telephone Encounter (Signed)
Written and printed

## 2022-11-22 ENCOUNTER — Telehealth: Payer: Self-pay | Admitting: Family Medicine

## 2022-11-22 NOTE — Telephone Encounter (Signed)
Copied from [place] [id number]. Topic: Medicare AWV >> Nov 22, 2022  9:06 AM Gillis Santa wrote: Reason for CRM: LVM PATIENT TO CALL [phone] [place]

## 2022-12-04 DIAGNOSIS — E119 Type 2 diabetes mellitus without complications: Secondary | ICD-10-CM | POA: Diagnosis not present

## 2022-12-04 LAB — HM DIABETES EYE EXAM

## 2022-12-07 DIAGNOSIS — I10 Essential (primary) hypertension: Secondary | ICD-10-CM | POA: Diagnosis not present

## 2022-12-07 DIAGNOSIS — Z1330 Encounter for screening examination for mental health and behavioral disorders, unspecified: Secondary | ICD-10-CM | POA: Diagnosis not present

## 2022-12-07 DIAGNOSIS — E0800 Diabetes mellitus due to underlying condition with hyperosmolarity without nonketotic hyperglycemic-hyperosmolar coma (NKHHC): Secondary | ICD-10-CM | POA: Diagnosis not present

## 2022-12-07 DIAGNOSIS — I252 Old myocardial infarction: Secondary | ICD-10-CM | POA: Diagnosis not present

## 2022-12-07 DIAGNOSIS — I2510 Atherosclerotic heart disease of native coronary artery without angina pectoris: Secondary | ICD-10-CM | POA: Diagnosis not present

## 2022-12-11 ENCOUNTER — Encounter: Payer: Self-pay | Admitting: Family Medicine

## 2022-12-11 ENCOUNTER — Telehealth: Payer: Self-pay

## 2022-12-11 ENCOUNTER — Ambulatory Visit (INDEPENDENT_AMBULATORY_CARE_PROVIDER_SITE_OTHER): Payer: Medicare Other | Admitting: Family Medicine

## 2022-12-11 VITALS — BP 120/80 | HR 59 | Temp 98.2°F | Ht 71.0 in | Wt 191.5 lb

## 2022-12-11 DIAGNOSIS — F419 Anxiety disorder, unspecified: Secondary | ICD-10-CM | POA: Diagnosis not present

## 2022-12-11 MED ORDER — QUETIAPINE FUMARATE 25 MG PO TABS: 25.0000 mg | ORAL_TABLET | Freq: Every day | ORAL | 2 refills | Status: DC

## 2022-12-11 NOTE — Telephone Encounter (Signed)
Patient's wife informed

## 2022-12-11 NOTE — Telephone Encounter (Signed)
PA approved. Effective from 12/11/2022 through 12/12/2023.

## 2022-12-11 NOTE — Progress Notes (Signed)
Chief Complaint  Patient presents with   Follow-up    Subjective Gary Barnett presents for f/u anxiety.  Pt is currently being treated with Celexa 40 mg/d, Remeron 7.5 mg/d was recently added. Uses Klonopin twice daily still.  Did not like the Remeron. Has failed BuSpar also.  Reports no sig change since treatment. No thoughts of harming self or others. No self-medication with alcohol, prescription drugs or illicit drugs. Pt is not following with a counselor/psychologist.  Past Medical History:  Diagnosis Date   Anxiety 11/11/2017   Benign prostatic hyperplasia 04/13/2014   Formatting of this note might be different from the original. 10/1 IMO update   CAD (coronary artery disease) 09/30/2015   Change in bowel habits 06/20/2016   Diabetes mellitus due to underlying condition with unspecified complications ([place]) 1/61/0960   Diabetes mellitus without complication ([place])    Encounter for colonoscopy due to history of adenomatous colonic polyps 05/20/2014   Essential hypertension 08/09/2014   GAD (generalized anxiety disorder) 05/04/2020   Hyperlipemia 08/09/2014   Hyperlipidemia    Hypertension    Insomnia 06/03/2019   Major depressive disorder with single episode, in full remission ([place]) 11/11/2017   Malaise 03/20/2018   Primary osteoarthritis involving multiple joints 11/11/2017   Risk for falls 01/23/2016   Screening for malignant neoplasm of prostate 03/20/2018   Seasonal allergies 08/03/2021   Sigmoid diverticulosis 07/08/2014   Type 2 diabetes mellitus with hyperglycemia, without long-term current use of insulin ([place]) 11/21/2015   Vitamin D deficiency 08/09/2014   Allergies as of 12/11/2022       Reactions   Clopidogrel Hives, Itching        Medication List        Accurate as of December 11, 2022  1:04 PM. If you have any questions, ask your nurse or doctor.          STOP taking these medications    mirtazapine 7.5 MG tablet Commonly known as: REMERON Stopped by: Shelda Pal, DO       TAKE these medications    Accu-Chek Aviva Plus test strip Generic drug: glucose blood Use three days to check blood sugar.  DX E11.9   Accu-Chek FastClix Lancets Misc Use daily to check blood sugar three times daily.  DXE11.9   ascorbic acid 500 MG tablet Commonly known as: VITAMIN C Take 500 mg by mouth 2 (two) times daily.   aspirin EC 81 MG tablet Take 1 tablet (81 mg total) by mouth daily. Swallow whole.   atorvastatin 80 MG tablet Commonly known as: LIPITOR Take 1 tablet (80 mg total) by mouth daily.   Brilinta 90 MG Tabs tablet Generic drug: ticagrelor Take 1 tablet (90 mg total) by mouth 2 (two) times daily.   citalopram 40 MG tablet Commonly known as: CELEXA Take 1 tablet (40 mg total) by mouth daily.   clonazePAM 0.5 MG tablet Commonly known as: KLONOPIN Take 0.5-1 tablets (0.25-0.5 mg total) by mouth 2 (two) times daily as needed for anxiety.   cyanocobalamin 1000 MCG tablet Commonly known as: VITAMIN B12 Take 1,000 mcg by mouth daily.   dapagliflozin propanediol 10 MG Tabs tablet Commonly known as: Farxiga Take 1 tablet (10 mg total) by mouth daily before breakfast.   ipratropium 0.03 % nasal spray Commonly known as: ATROVENT Place 2 sprays into both nostrils 3 (three) times daily as needed for rhinitis.   levocetirizine 5 MG tablet Commonly known as: XYZAL Take 1 tablet (5 mg total) by mouth  every evening.   lisinopril 5 MG tablet Commonly known as: ZESTRIL Take 1 tablet (5 mg total) by mouth daily.   metFORMIN 1000 MG tablet Commonly known as: GLUCOPHAGE Take 1 tablet (1,000 mg total) by mouth 2 (two) times daily with a meal.   metoprolol succinate 50 MG 24 hr tablet Commonly known as: TOPROL-XL Take 1 tablet (50 mg total) by mouth daily. Take with or immediately following a meal.   montelukast 10 MG tablet Commonly known as: SINGULAIR Take 1 tablet (10 mg total) by mouth at bedtime.   nitroGLYCERIN 0.4 MG SL  tablet Commonly known as: Nitrostat Place 1 tablet (0.4 mg total) under the tongue every 5 (five) minutes x 3 doses as needed for chest pain.   pantoprazole 20 MG tablet Commonly known as: PROTONIX Take 1 tablet (20 mg total) by mouth daily.   QUEtiapine 25 MG tablet Commonly known as: SEROquel Take 1 tablet (25 mg total) by mouth at bedtime. Started by: Shelda Pal, DO   repaglinide 1 MG tablet Commonly known as: PRANDIN TAKE 1 TABLET(1 MG) BY MOUTH THREE TIMES DAILY BEFORE MEALS   Zinc 50 MG Tabs Take 1 tablet by mouth daily.        Exam BP 120/80 (BP Location: Left Arm, Patient Position: Sitting, Cuff Size: Normal)   Pulse (!) 59   Temp 98.2 F (36.8 C) (Oral)   Ht '5\' 11"'$  (1.803 m)   Wt 191 lb 8 oz (86.9 kg)   SpO2 99%   BMI 26.71 kg/m  General:  well developed, well nourished, in no apparent distress Lungs:  No respiratory distress Psych: well oriented with normal range of affect and age-appropriate judgement/insight, alert and oriented x4.  Assessment and Plan  Anxiety - Plan: QUEtiapine (SEROQUEL) 25 MG tablet  Chronic, uncontrolled. Cont Celexa 40 mg/d, Klonpin bid prn. Psych resources provided in AVS. Start Seroquel 25 mg qhs.  F/u in 1 mo. The patient voiced understanding and agreement to the plan.  [place], DO 12/11/22 1:04 PM

## 2022-12-11 NOTE — Patient Instructions (Signed)
Stay active.   [facility] [address] [place]ily Memos [place], [place] [address] [phone]  [facility] [address] [place], [place] [address] [phone]  [facility] [address] [place], [place] [address] [phone]  [facility] [address], [place], [place], #[phone] [address], [place], [place], [place]  [facility], [place]  [place] [facility] [place], [place], [place]  [facility] [place], [place]  [facility] in [place] [address] [place], [place].   Please go online to complete a form to submit first.  [facility]  [address] [place], [place].     [place] -  local practices located at: [address], [place], [place]. [phone]. [place], [place], [place]. [address], [place], [place], [place].  [phone].  Contact one of these offices sooner than later as it can take 2-3 months to get a new patient appointment.

## 2022-12-11 NOTE — Telephone Encounter (Signed)
Called left message to call back 

## 2022-12-11 NOTE — Telephone Encounter (Signed)
PA initiated via Covermymeds; KEY: [id number]. Awaiting determination.

## 2023-01-09 ENCOUNTER — Ambulatory Visit: Payer: Medicare Other | Admitting: Family Medicine

## 2023-02-08 ENCOUNTER — Telehealth: Payer: Self-pay | Admitting: Family Medicine

## 2023-02-08 NOTE — Telephone Encounter (Signed)
Pt needs to r/s his appt on 4/10 with Tammy.

## 2023-02-11 NOTE — Telephone Encounter (Signed)
Left message on VM with some available dates and times for patients to reschedule appt.  Also left CB # [phone] or [phone]

## 2023-02-13 ENCOUNTER — Telehealth: Payer: BLUE CROSS/BLUE SHIELD

## 2023-02-13 ENCOUNTER — Encounter: Payer: Self-pay | Admitting: Family Medicine

## 2023-02-13 ENCOUNTER — Ambulatory Visit (INDEPENDENT_AMBULATORY_CARE_PROVIDER_SITE_OTHER): Payer: Medicare Other | Admitting: Family Medicine

## 2023-02-13 DIAGNOSIS — F419 Anxiety disorder, unspecified: Secondary | ICD-10-CM | POA: Diagnosis not present

## 2023-02-13 MED ORDER — QUETIAPINE FUMARATE 25 MG PO TABS: 25.0000 mg | ORAL_TABLET | Freq: Every day | ORAL | 3 refills | Status: DC

## 2023-02-13 MED ORDER — ACCU-CHEK AVIVA PLUS VI STRP: ORAL_STRIP | 3 refills | Status: DC

## 2023-02-13 MED ORDER — ACCU-CHEK FASTCLIX LANCETS MISC: 3 refills | Status: DC

## 2023-02-13 NOTE — Addendum Note (Signed)
Addended by: Scharlene Gloss B on: 02/13/2023 01:42 PM   Modules accepted: Orders

## 2023-02-13 NOTE — Progress Notes (Signed)
Chief Complaint  Patient presents with   Follow-up    Subjective Gary Barnett presents for f/u anxiety. Here w spouse.   Pt is currently being treated with Celexa 40 mg/d, Seroquel 25 mg/d. No longer requiring Klonopin.  Failed BuSpar and Remeron.  Reports doing OK since treatment. No thoughts of harming self or others. No self-medication with alcohol, prescription drugs or illicit drugs. Pt is not following with a counselor/psychologist.  Past Medical History:  Diagnosis Date   Anxiety 11/11/2017   Benign prostatic hyperplasia 04/13/2014   Formatting of this note might be different from the original. 10/1 IMO update   CAD (coronary artery disease) 09/30/2015   Change in bowel habits 06/20/2016   Diabetes mellitus due to underlying condition with unspecified complications 06/18/2017   Diabetes mellitus without complication    Encounter for colonoscopy due to history of adenomatous colonic polyps 05/20/2014   Essential hypertension 08/09/2014   GAD (generalized anxiety disorder) 05/04/2020   Hyperlipemia 08/09/2014   Hyperlipidemia    Hypertension    Insomnia 06/03/2019   Major depressive disorder with single episode, in full remission 11/11/2017   Malaise 03/20/2018   Primary osteoarthritis involving multiple joints 11/11/2017   Risk for falls 01/23/2016   Screening for malignant neoplasm of prostate 03/20/2018   Seasonal allergies 08/03/2021   Sigmoid diverticulosis 07/08/2014   Type 2 diabetes mellitus with hyperglycemia, without long-term current use of insulin 11/21/2015   Vitamin D deficiency 08/09/2014   Allergies as of 02/13/2023       Reactions   Clopidogrel Hives, Itching        Medication List        Accurate as of February 13, 2023  1:24 PM. If you have any questions, ask your nurse or doctor.          Accu-Chek Aviva Plus test strip Generic drug: glucose blood Use three days to check blood sugar.  DX E11.9   Accu-Chek FastClix Lancets Misc Use daily to check blood  sugar three times daily.  DXE11.9   ascorbic acid 500 MG tablet Commonly known as: VITAMIN C Take 500 mg by mouth 2 (two) times daily.   aspirin EC 81 MG tablet Take 1 tablet (81 mg total) by mouth daily. Swallow whole.   atorvastatin 80 MG tablet Commonly known as: LIPITOR Take 1 tablet (80 mg total) by mouth daily.   Brilinta 90 MG Tabs tablet Generic drug: ticagrelor Take 1 tablet (90 mg total) by mouth 2 (two) times daily.   citalopram 40 MG tablet Commonly known as: CELEXA Take 1 tablet (40 mg total) by mouth daily.   clonazePAM 0.5 MG tablet Commonly known as: KLONOPIN Take 0.5-1 tablets (0.25-0.5 mg total) by mouth 2 (two) times daily as needed for anxiety.   cyanocobalamin 1000 MCG tablet Commonly known as: VITAMIN B12 Take 1,000 mcg by mouth daily.   dapagliflozin propanediol 10 MG Tabs tablet Commonly known as: Farxiga Take 1 tablet (10 mg total) by mouth daily before breakfast.   ipratropium 0.03 % nasal spray Commonly known as: ATROVENT Place 2 sprays into both nostrils 3 (three) times daily as needed for rhinitis.   levocetirizine 5 MG tablet Commonly known as: XYZAL Take 1 tablet (5 mg total) by mouth every evening.   lisinopril 5 MG tablet Commonly known as: ZESTRIL Take 1 tablet (5 mg total) by mouth daily.   metFORMIN 1000 MG tablet Commonly known as: GLUCOPHAGE Take 1 tablet (1,000 mg total) by mouth 2 (two) times daily  with a meal.   metoprolol succinate 50 MG 24 hr tablet Commonly known as: TOPROL-XL Take 1 tablet (50 mg total) by mouth daily. Take with or immediately following a meal.   montelukast 10 MG tablet Commonly known as: SINGULAIR Take 1 tablet (10 mg total) by mouth at bedtime.   nitroGLYCERIN 0.4 MG SL tablet Commonly known as: Nitrostat Place 1 tablet (0.4 mg total) under the tongue every 5 (five) minutes x 3 doses as needed for chest pain.   pantoprazole 20 MG tablet Commonly known as: PROTONIX Take 1 tablet (20 mg  total) by mouth daily.   QUEtiapine 25 MG tablet Commonly known as: SEROquel Take 1 tablet (25 mg total) by mouth at bedtime.   repaglinide 1 MG tablet Commonly known as: PRANDIN TAKE 1 TABLET(1 MG) BY MOUTH THREE TIMES DAILY BEFORE MEALS   Zinc 50 MG Tabs Take 1 tablet by mouth daily.        Exam BP 124/80 (BP Location: Left Arm, Patient Position: Sitting, Cuff Size: Normal)   Pulse 61   Temp 97.9 F (36.6 C) (Oral)   Ht 6' (1.829 m)   Wt 193 lb (87.5 kg)   SpO2 97%   BMI 26.18 kg/m  General:  well developed, well nourished, in no apparent distress Lungs:  No respiratory distress Psych: well oriented with normal range of affect and age-appropriate judgement/insight, alert and oriented x4.  Assessment and Plan  Anxiety - Plan: QUEtiapine (SEROQUEL) 25 MG tablet  Chronic, stable. Cont Celexa 40 mg/d, Seroquel 25 mg/d. Klonopin sparingly.  F/u in 2 mo for DM visit. The patient and his spouse voiced understanding and agreement to the plan.  Jilda Roche [place], DO 02/13/23 1:24 PM

## 2023-02-13 NOTE — Patient Instructions (Signed)
Let us know if you need anything.  

## 2023-02-14 MED ORDER — BRILINTA 90 MG PO TABS: 90.0000 mg | ORAL_TABLET | Freq: Two times a day (BID) | ORAL | 2 refills | Status: DC

## 2023-02-14 MED ORDER — DAPAGLIFLOZIN PROPANEDIOL 10 MG PO TABS: 10.0000 mg | ORAL_TABLET | Freq: Every day | ORAL | 2 refills | Status: DC

## 2023-02-14 NOTE — Addendum Note (Signed)
Addended by: Henrene Pastor B on: 02/14/2023 03:37 PM   Modules accepted: Orders

## 2023-02-14 NOTE — Telephone Encounter (Signed)
Appt made for 02/21/2023 at 10:30am.  Patient needed updated [place]'s for [place] for [place] and Farxiga - sent to [facility].

## 2023-02-21 ENCOUNTER — Telehealth: Payer: Self-pay | Admitting: Pharmacist

## 2023-02-21 ENCOUNTER — Other Ambulatory Visit: Payer: Medicare Other | Admitting: Pharmacist

## 2023-02-21 NOTE — Telephone Encounter (Signed)
Patient was scheduled for follow up phone visit with Clinical Pharmacist Practitioner today. Unable to reach patient or his wife - attempted outreach x 2.  I did speak with patient and his wife last week. Updated Rx for Tito Dine and Farxiga at [facility] for [place] medication [place].  Last delivery of Farxiga to patient was 01/30/2023 and last delivery of Brillenta was 12/17/2022.

## 2023-03-07 IMAGING — CT CT HEART MORP W/ CTA COR W/ SCORE W/ CA W/CM &/OR W/O CM
4 of 7 series · 8 of 20 positions shown, 9 images · IV contrast (APPLIED)
Comparison: None Available.
COMPARISON: None Available.

Addendum:
EXAM:
OVER-READ INTERPRETATION  CT CHEST

The following report is a limited chest CT over-read performed by
03/29/2022. The coronary CTA interpretation by the cardiologist is
attached.
CLINICAL DATA: CP
Cardiac/Coronary  CTA
TECHNIQUE: The patient was scanned on a [employer] Force scanner.

[Series 6: ts diast sharp · axial · 0.39mm/px · z∈[+1145,+1178]mm · 2 of 243 slices shown]
[im 81/243  lung]
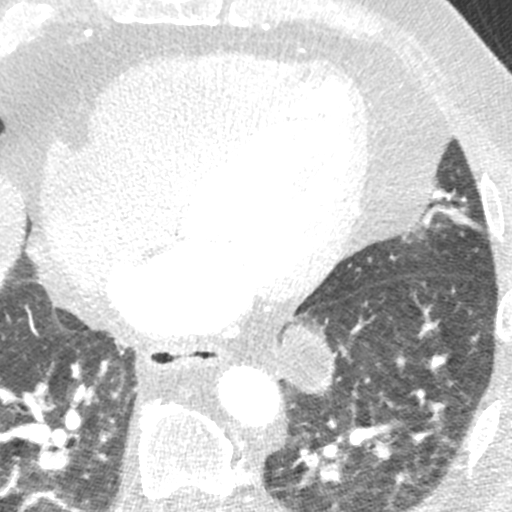
[im 162/243  lung]
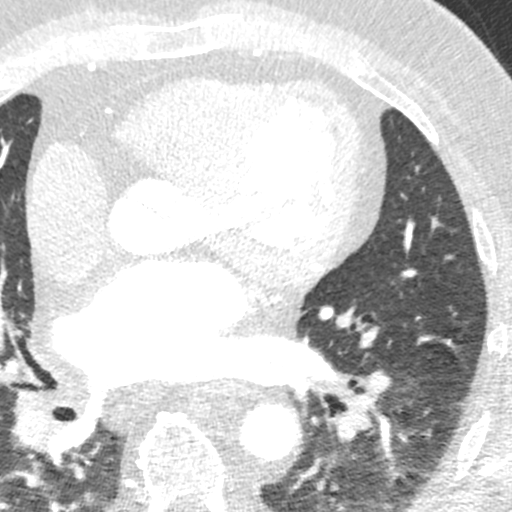

[Series 7: ts syst sharp · axial · 0.39mm/px · z∈[+1145,+1178]mm · 2 of 243 slices shown]
[im 81/243  lung]
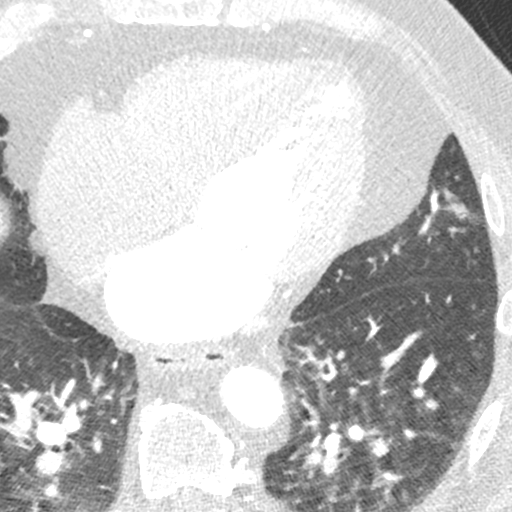
[im 162/243  lung]
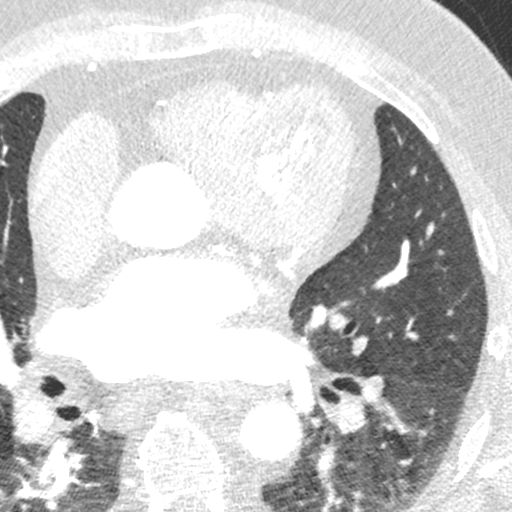

[Series 8: best syst · axial · 0.39mm/px · z∈[+1145,+1178]mm · 2 of 243 slices shown, 3 images]
[im 81/243  vessel]
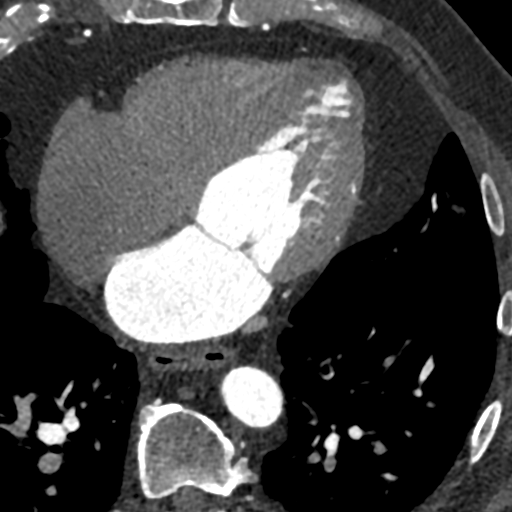
[im 81/243  lung]
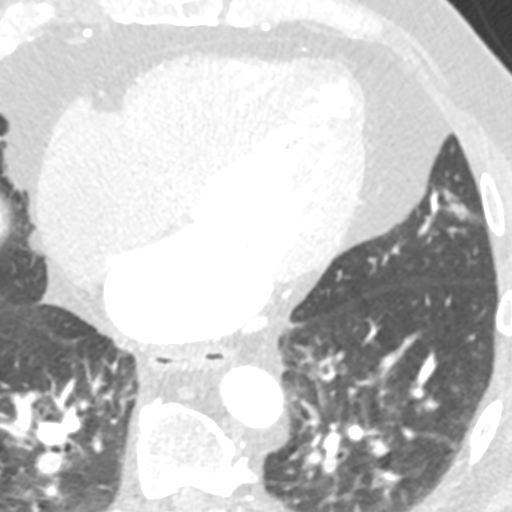
[im 162/243  vessel]
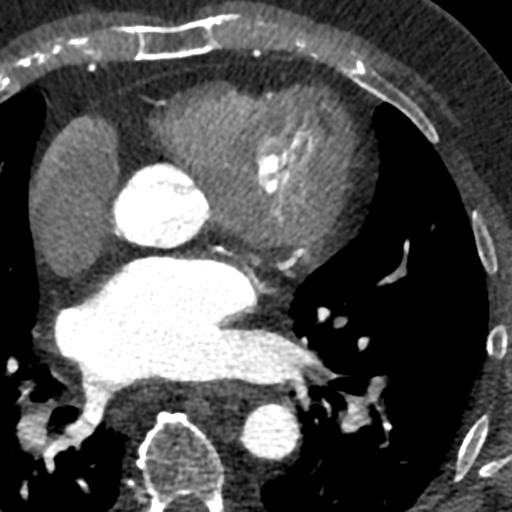

[Series 9: best diast · axial · 0.39mm/px · z∈[+1145,+1178]mm · 2 of 243 slices shown]
[im 81/243  vessel]
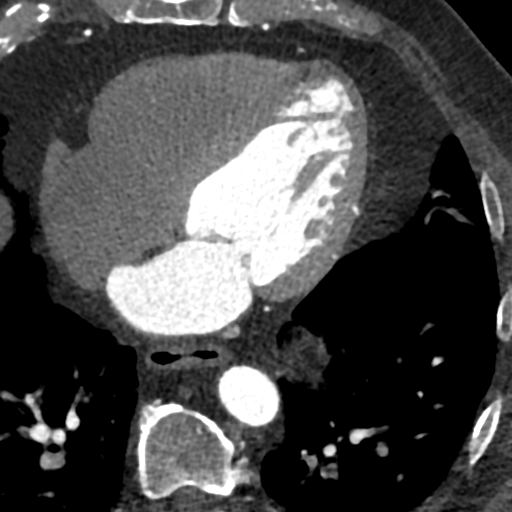
[im 162/243  vessel]
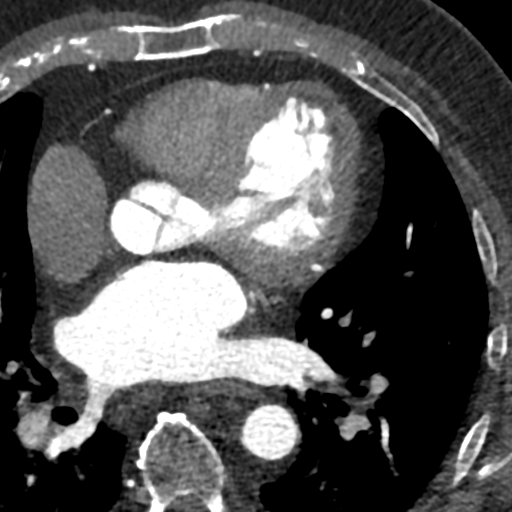

[8 of 20 positions shown; findings below may reference images not displayed]

FINDINGS: Vascular: No definite abnormality seen involving the visualized
portions of the extracardiac vascular structures.

Mediastinum/Nodes: No definite abnormality seen involving the
visualized mediastinum.

Lungs/Pleura: There is noted probable scarring and focal bullous
emphysematous change seen in the right middle lobe.

Upper Abdomen: Visualized portion of upper abdomen is unremarkable.

Musculoskeletal: No definite abnormality seen involving the
visualized skeleton.
IMPRESSION: Probable scarring and focal bullous emphysematous changes seen in
the right middle lobe.
FINDINGS: A 120 kV prospective scan was triggered in the descending thoracic
aorta at 111 HU's. Axial non-contrast 3 mm slices were carried out
through the heart. The data set was analyzed on a dedicated work
station and scored using the Agatson method. Gantry rotation speed
was 250 msecs and collimation was .6 mm. No beta blockade and 0.8 mg
of sl NTG was given. The 3D data set was reconstructed in 5%
intervals of the 67-82 % of the R-R cycle. Diastolic phases were
analyzed on a dedicated work station using MPR, MIP and VRT modes.
The patient received 80 cc of contrast.

Aorta:  Normal size.  No calcifications.  No dissection.

Aortic Valve:  Trileaflet.  Mild calcifications.

Coronary Arteries:  Normal coronary origin.  Right dominance.

RCA is atretic and not dominant.

Left main is a large artery that gives rise to LAD and LCX arteries
as well as large intermediate branch.

LAD is a large vessel that has stent its proximal portion. Stent is
patent but there is moderate stenosis of 50-69% after the stent.

Large intermediate branch has calcifications in its proximal portion
with soft plaque and moderate stenosis of 50-69%.

LCX is a dominant artery that has calcified plaque it is proximal
portion, followed by soft plaque with at least moderate stenosis of
50-69%.

Other findings:

Normal pulmonary vein drainage into the left atrium.

Normal left atrial appendage without a thrombus.

Normal size of the pulmonary artery.

Evidence of LAD infarct.

Aneurysm of the membranous portion of the intraventricular septum
(possibly closed membranous VSD). No evidence of intracardiac shunt.
IMPRESSION: 1. Coronary calcium score - not calculated secondary to presence of
stent.

2. Atretic RCA, dominant PALMA

3. Patent stent of the proximal LAD followed by moderate stenosis.

4. CAD-RADS 3. Moderate stenosis (Soza, Regan). Consider
symptom-guided anti-ischemic pharmacotherapy as well as risk factor
modification per guideline directed care. Additional analysis with
CT FFR will be submitted.

5. Evidence of MI in LAD territory

6. Aneurysm of the membranous portion of the intraventricular septum
without shunt.

*** End of Addendum ***
EXAM:
OVER-READ INTERPRETATION  CT CHEST

The following report is a limited chest CT over-read performed by
03/29/2022. The coronary CTA interpretation by the cardiologist is
attached.
FINDINGS: Vascular: No definite abnormality seen involving the visualized
portions of the extracardiac vascular structures.

Mediastinum/Nodes: No definite abnormality seen involving the
visualized mediastinum.

Lungs/Pleura: There is noted probable scarring and focal bullous
emphysematous change seen in the right middle lobe.

Upper Abdomen: Visualized portion of upper abdomen is unremarkable.

Musculoskeletal: No definite abnormality seen involving the
visualized skeleton.
IMPRESSION: Probable scarring and focal bullous emphysematous changes seen in
the right middle lobe.

## 2023-03-12 ENCOUNTER — Telehealth: Payer: Self-pay | Admitting: Family Medicine

## 2023-03-12 NOTE — Telephone Encounter (Signed)
Patients has become more forgetful and would like a referral to a neurologist

## 2023-03-12 NOTE — Telephone Encounter (Signed)
Sched appt. Neuro has been blocking our referrals without a specific OV.

## 2023-03-13 NOTE — Telephone Encounter (Signed)
Called and scheduled on 03/19/23.

## 2023-03-13 NOTE — Telephone Encounter (Signed)
Called left message to call back 

## 2023-03-19 ENCOUNTER — Ambulatory Visit: Payer: Medicare Other | Admitting: Family Medicine

## 2023-03-21 ENCOUNTER — Telehealth: Payer: Self-pay | Admitting: Pharmacist

## 2023-03-21 NOTE — Telephone Encounter (Signed)
Unsuccessful return phone call  - LM on VM with CB# [phone]

## 2023-03-21 NOTE — Telephone Encounter (Signed)
Delaney Meigs (spouse) called stating that she would like to look into am appt for pt

## 2023-03-22 MED ORDER — BRILINTA 90 MG PO TABS: 90.0000 mg | ORAL_TABLET | Freq: Two times a day (BID) | ORAL | 2 refills | Status: DC

## 2023-03-22 MED ORDER — DAPAGLIFLOZIN PROPANEDIOL 10 MG PO TABS: 10.0000 mg | ORAL_TABLET | Freq: Every day | ORAL | 2 refills | Status: DC

## 2023-03-22 NOTE — Addendum Note (Signed)
Addended by: Henrene Pastor B on: 03/22/2023 04:53 PM   Modules accepted: Orders

## 2023-03-22 NOTE — Telephone Encounter (Signed)
Requested Rx's for Brilinta and Farxiga - sent to pharmacy

## 2023-03-29 ENCOUNTER — Other Ambulatory Visit (HOSPITAL_COMMUNITY): Payer: Self-pay

## 2023-03-29 ENCOUNTER — Other Ambulatory Visit: Payer: Medicare Other | Admitting: Pharmacist

## 2023-03-29 DIAGNOSIS — E1165 Type 2 diabetes mellitus with hyperglycemia: Secondary | ICD-10-CM

## 2023-03-29 MED ORDER — NITROGLYCERIN 0.4 MG SL SUBL: 0.4000 mg | SUBLINGUAL_TABLET | SUBLINGUAL | 0 refills | Status: DC | PRN

## 2023-03-29 MED ORDER — METFORMIN HCL 1000 MG PO TABS
1000.0000 mg | ORAL_TABLET | Freq: Two times a day (BID) | ORAL | 3 refills | Status: DC
Start: 2023-03-29 — End: 2023-11-05

## 2023-03-29 NOTE — Progress Notes (Signed)
03/29/2023 Name: Gary Barnett MRN: [id number] DOB: 06-13-1951  Chief Complaint  Patient presents with   Medication Management    Rudolpho Congo is a 72 y.o. year old male who presented for a telephone visit.   They were referred to the pharmacist by their PCP for assistance in managing medication access and complex medication management.    Subjective:  Care Team: Primary Care Provider: Sharlene Dory, DO ; Next Scheduled Visit: 04/17/2023  Medication Access/Adherence  Current Pharmacy:  [facility]) [facility] - [place], [place] - [address] [place] [address] Phone: [phone] Fax: [phone]  [id number] - [place], [place] - [address] AT [address] [place] [address] Phone: [phone] Fax: [phone]  [facility] - [place], [place] - [address] [place] [address] Phone: [phone] Fax: [phone]   Patient reports affordability concerns with their medications: Yes  -  approved for LIS for rest of 2024 Patient reports access/transportation concerns to their pharmacy: No  Patient reports adherence concerns with their medications:  No      Diabetes:  Current medications: Farxiga 10mg  daily, metformin 1000mg  twice a day and repaglinide 1mg  3 times a day  Medications tried in the past: pioglitazone - stopped due to edema; glipizide  Current glucose readings: 100 to 130's testing 1 times daily   Patient denies hypoglycemic s/sx including no dizziness, shakiness, sweating. Patient denies hyperglycemic symptoms including no polyuria, polydipsia, polyphagia, nocturia, neuropathy, blurred vision.  Patient [place] form [place] in past but he is now able to get for $11.20 / 90 days since he was approved for LIS / Medicare Extra Help   Hyperlipidemia/ASCVD Risk Reduction  Current lipid lowering  medications:  atorvastatin 80mg  daily   Antiplatelet regimen: Brilinta 90 mg twice a day  Patient has [place] Brilinta [place] in past but he is now able to get for $11.20 / 90 days since he was approved for LIS / Medicare Extra Help   Objective:  Lab Results  Component Value Date   HGBA1C 7.1 (H) 10/09/2022    Lab Results  Component Value Date   CREATININE 0.91 11/06/2022   BUN 16 11/06/2022   NA 138 11/06/2022   K 4.6 11/06/2022   CL 99 11/06/2022   CO2 23 11/06/2022    Lab Results  Component Value Date   CHOL 93 10/09/2022   HDL 32.20 (L) 10/09/2022   LDLCALC 35 10/09/2022   TRIG 131.0 10/09/2022   CHOLHDL 3 10/09/2022    Medications Reviewed Today     Reviewed by Henrene Pastor, RPH-CPP (Pharmacist) on 03/29/23 at 1044  Med List Status: <None>   Medication Order Taking? Sig Documenting Provider Last Dose Status Informant  Accu-Chek FastClix Lancets MISC [id number] Yes Use daily to check blood sugar three times daily.  DXE11.9 Sharlene Dory, DO Taking Active   Ascorbic Acid (VITAMIN C) 1000 MG tablet [id number] Yes Take 1,000 mg by mouth daily. [provider] Taking Active   aspirin EC 81 MG tablet [id number] Yes Take 1 tablet (81 mg total) by mouth daily. Swallow whole. Revankar, Aundra Dubin, MD Taking Active   atorvastatin (LIPITOR) 80 MG tablet [id number] Yes Take 1 tablet (80 mg total) by mouth daily. Sharlene Dory, DO Taking Active   BRILINTA 90 MG TABS tablet [id number] Yes Take  1 tablet (90 mg total) by mouth 2 (two) times daily. Sharlene Dory, DO Taking Active   citalopram (CELEXA) 40 MG tablet [id number] Yes Take 1 tablet (40 mg total) by mouth daily. Sharlene Dory, DO Taking Active   clonazePAM (KLONOPIN) 0.5 MG tablet [id number] Yes Take 0.5-1 tablets (0.25-0.5 mg total) by mouth 2 (two) times daily as needed for anxiety. Sharlene Dory, DO Taking Active   cyanocobalamin (VITAMIN B12) 1000 MCG  tablet [id number] Yes Take 1,000 mcg by mouth daily. [provider] Taking Active   dapagliflozin propanediol (FARXIGA) 10 MG TABS tablet [id number] Yes Take 1 tablet (10 mg total) by mouth daily before breakfast. Sharlene Dory, DO Taking Active   glucose blood (ACCU-CHEK AVIVA PLUS) test strip [id number] Yes Use three days to check blood sugar.  DX E11.9 Sharlene Dory, DO Taking Active   levocetirizine (XYZAL) 5 MG tablet [id number] Yes Take 1 tablet (5 mg total) by mouth every evening. Sharlene Dory, DO Taking Active   lisinopril (ZESTRIL) 5 MG tablet [id number] Yes Take 1 tablet (5 mg total) by mouth daily. Sharlene Dory, DO Taking Active   metFORMIN (GLUCOPHAGE) 1000 MG tablet [id number] Yes Take 1 tablet (1,000 mg total) by mouth 2 (two) times daily with a meal. Sharlene Dory, DO Taking Active   metoprolol succinate (TOPROL-XL) 50 MG 24 hr tablet [id number] Yes Take 1 tablet (50 mg total) by mouth daily. Take with or immediately following a meal. Sharlene Dory, DO Taking Active   montelukast (SINGULAIR) 10 MG tablet [id number] Yes Take 1 tablet (10 mg total) by mouth at bedtime. Sharlene Dory, DO Taking Active   nitroGLYCERIN (NITROSTAT) 0.4 MG SL tablet [id number] Yes Place 1 tablet (0.4 mg total) under the tongue every 5 (five) minutes x 3 doses as needed for chest pain. Sharlene Dory, DO Taking Active   pantoprazole (PROTONIX) 20 MG tablet [id number] Yes Take 1 tablet (20 mg total) by mouth daily. Sharlene Dory, DO Taking Active   QUEtiapine (SEROQUEL) 25 MG tablet [id number] Yes Take 1 tablet (25 mg total) by mouth at bedtime. Sharlene Dory, DO Taking Active   repaglinide (PRANDIN) 1 MG tablet [id number] Yes TAKE 1 TABLET(1 MG) BY MOUTH THREE TIMES DAILY BEFORE MEALS Sharlene Dory, DO Taking Active   Zinc 50 MG TABS [id number] Yes Take 1 tablet by mouth daily. [provider]  Taking Active               Assessment/Plan:   Diabetes: Last A1c was 7.1% - good given ASCVD risk (goal A1c < 7.5%) - Reviewed goal A1c, goal fasting, and goal 2 hour post prandial glucose - Recommend to continue current regimen for diabetes - Farxiga, metformin and repaglinide  - Recommend to check glucose daily and record - due to have A1c checked - will see PCP next month for labs.   Hyperlipidemia/ASCVD Risk Reduction:Currently controlled. LDL goal < 55 - Reviewed long term complications of uncontrolled cholesterol - Recommend to continue atorvastatin and Brilinta  Meds ordered this encounter  Medications   metFORMIN (GLUCOPHAGE) 1000 MG tablet    Sig: Take 1 tablet (1,000 mg total) by mouth 2 (two) times daily with a meal.    Dispense:  180 tablet    Refill:  3   nitroGLYCERIN (NITROSTAT) 0.4 MG SL tablet    Sig: Place 1 tablet (0.4 mg total) under the tongue every 5 (five) minutes x 3 doses as needed  for chest pain.    Dispense:  25 tablet    Refill:  0    Follow Up Plan: 3 months; will see PCP 04/2023  Henrene Pastor, PharmD Clinical Pharmacist [facility]

## 2023-04-17 ENCOUNTER — Ambulatory Visit (INDEPENDENT_AMBULATORY_CARE_PROVIDER_SITE_OTHER): Payer: Medicare Other | Admitting: Family Medicine

## 2023-04-17 ENCOUNTER — Encounter: Payer: Self-pay | Admitting: Family Medicine

## 2023-04-17 ENCOUNTER — Other Ambulatory Visit: Payer: Self-pay | Admitting: Family Medicine

## 2023-04-17 VITALS — BP 122/72 | HR 72 | Temp 98.0°F | Ht 72.0 in | Wt 192.4 lb

## 2023-04-17 DIAGNOSIS — I10 Essential (primary) hypertension: Secondary | ICD-10-CM | POA: Diagnosis not present

## 2023-04-17 DIAGNOSIS — E1165 Type 2 diabetes mellitus with hyperglycemia: Secondary | ICD-10-CM

## 2023-04-17 DIAGNOSIS — Z7984 Long term (current) use of oral hypoglycemic drugs: Secondary | ICD-10-CM

## 2023-04-17 DIAGNOSIS — R413 Other amnesia: Secondary | ICD-10-CM | POA: Diagnosis not present

## 2023-04-17 LAB — LIPID PANEL
Cholesterol: 103 mg/dL (ref 0–200)
HDL: 29.6 mg/dL — ABNORMAL LOW (ref >39.00)
LDL Cholesterol: 35 mg/dL (ref 0–99)
NonHDL: 73.49
Total CHOL/HDL Ratio: 3
Triglycerides: 193 mg/dL — ABNORMAL HIGH (ref 0.0–149.0)
VLDL: 38.6 mg/dL (ref 0.0–40.0)

## 2023-04-17 LAB — COMPREHENSIVE METABOLIC PANEL
ALT: 18 U/L (ref 0–53)
AST: 13 U/L (ref 0–37)
Albumin: 4.5 g/dL (ref 3.5–5.2)
Alkaline Phosphatase: 106 U/L (ref 39–117)
BUN: 16 mg/dL (ref 6–23)
CO2: 25 mEq/L (ref 19–32)
Calcium: 9.1 mg/dL (ref 8.4–10.5)
Chloride: 102 mEq/L (ref 96–112)
Creatinine, Ser: 0.88 mg/dL (ref 0.40–1.50)
GFR: 87.63 mL/min (ref >60.00)
Glucose, Bld: 131 mg/dL — ABNORMAL HIGH (ref 70–99)
Potassium: 4.7 mEq/L (ref 3.5–5.1)
Sodium: 139 mEq/L (ref 135–145)
Total Bilirubin: 1 mg/dL (ref 0.2–1.2)
Total Protein: 6.7 g/dL (ref 6.0–8.3)

## 2023-04-17 LAB — HEMOGLOBIN A1C: Hgb A1c MFr Bld: 7.9 % — ABNORMAL HIGH (ref 4.6–6.5)

## 2023-04-17 MED ORDER — SEMAGLUTIDE 3 MG PO TABS: 3.0000 mg | ORAL_TABLET | Freq: Every day | ORAL | 0 refills | Status: AC

## 2023-04-17 MED ORDER — SEMAGLUTIDE 7 MG PO TABS: 7.0000 mg | ORAL_TABLET | Freq: Every day | ORAL | 1 refills | Status: DC

## 2023-04-17 NOTE — Patient Instructions (Addendum)
Give us 2-3 business days to get the results of your labs back.   Keep the diet clean and stay active.  If you do not hear anything about your referral in the next 1-2 weeks, call our office and ask for an update.  Let us know if you need anything. 

## 2023-04-17 NOTE — Progress Notes (Signed)
Subjective:   Chief Complaint  Patient presents with   Follow-up    Sleep medicine is working well.    Gary Barnett is a 72 y.o. male here for follow-up of diabetes.   Goku's self monitored glucose range is 80-130's.  Patient denies hypoglycemic reactions. He checks his glucose levels 1 time(s) per day. Patient does not require insulin.   Medications include: prandin 1 mg TID, metformin 1000 mg bid, Farxiga 10 mg/d Diet is fair.  Exercise: walking  Hypertension Patient presents for hypertension follow up. He does not routinely monitor home blood pressures. He is compliant with medication- lisinopril 5 mg/d. Patient has these side effects of medication: none Diet/exercise as above. No Cp or SOB.   Past Medical History:  Diagnosis Date   Anxiety 11/11/2017   Benign prostatic hyperplasia 04/13/2014   Formatting of this note might be different from the original. 10/1 IMO update   CAD (coronary artery disease) 09/30/2015   Change in bowel habits 06/20/2016   Diabetes mellitus due to underlying condition with unspecified complications (HCC) 06/18/2017   Diabetes mellitus without complication (HCC)    Encounter for colonoscopy due to history of adenomatous colonic polyps 05/20/2014   Essential hypertension 08/09/2014   GAD (generalized anxiety disorder) 05/04/2020   Hyperlipemia 08/09/2014   Hyperlipidemia    Hypertension    Insomnia 06/03/2019   Major depressive disorder with single episode, in full remission (HCC) 11/11/2017   Malaise 03/20/2018   Primary osteoarthritis involving multiple joints 11/11/2017   Risk for falls 01/23/2016   Screening for malignant neoplasm of prostate 03/20/2018   Seasonal allergies 08/03/2021   Sigmoid diverticulosis 07/08/2014   Type 2 diabetes mellitus with hyperglycemia, without long-term current use of insulin (HCC) 11/21/2015   Vitamin D deficiency 08/09/2014     Related testing: Retinal exam: Done Pneumovax: done  Objective:  BP 122/72 (BP  Location: Right Arm, Patient Position: Sitting, Cuff Size: Normal)   Pulse 72   Temp 98 F (36.7 C) (Oral)   Ht 6' (1.829 m)   Wt 192 lb 6 oz (87.3 kg)   SpO2 98%   BMI 26.09 kg/m  General:  Well developed, well nourished, in no apparent distress Skin:  Warm, no pallor or diaphoresis on exposed skin Lungs:  CTAB, no access msc use Cardio:  RRR, no bruits, no LE edema Psych: Age appropriate judgment and insight  Assessment:   Type 2 diabetes mellitus with hyperglycemia, without long-term current use of insulin (HCC) - Plan: Comprehensive metabolic panel, Lipid panel, Hemoglobin A1c  Essential hypertension  Memory deficit - Plan: Ambulatory referral to Neurology   Plan:   Chronic, hopefully stable.  Continue metformin 1000 mg twice daily, Farxiga 10 mg daily, Prandin 1 mg 3 times daily.  Counseled on diet and exercise. Chronic, stable.  Continue lisinopril 5 mg daily. The patient's wife mentions some difficulty remembering things.  They would like a neurology consultation.  Referral placed today.  Lab workup from 6 months ago was unremarkable. F/u in 6 mo. The patient voiced understanding and agreement to the plan.  Jilda Roche Phelps, DO 04/17/23 11:36 AM

## 2023-04-19 ENCOUNTER — Telehealth: Payer: Self-pay

## 2023-04-19 NOTE — Telephone Encounter (Signed)
PA approved.   Approved. . Authorization Expiration Date: April 18, 2024.

## 2023-04-19 NOTE — Telephone Encounter (Signed)
PA initiated via Covermymeds; KEY: [id number]

## 2023-06-26 ENCOUNTER — Other Ambulatory Visit: Payer: Self-pay | Admitting: Family Medicine

## 2023-06-28 ENCOUNTER — Other Ambulatory Visit: Payer: Medicare Other | Admitting: Pharmacist

## 2023-06-28 DIAGNOSIS — E1165 Type 2 diabetes mellitus with hyperglycemia: Secondary | ICD-10-CM

## 2023-06-28 MED ORDER — LISINOPRIL 5 MG PO TABS: 5.0000 mg | ORAL_TABLET | Freq: Every day | ORAL | 1 refills | Status: DC

## 2023-06-28 MED ORDER — ATORVASTATIN CALCIUM 80 MG PO TABS
80.0000 mg | ORAL_TABLET | Freq: Every day | ORAL | 1 refills | Status: DC
Start: 2023-06-28 — End: 2024-03-27

## 2023-06-28 MED ORDER — REPAGLINIDE 1 MG PO TABS
ORAL_TABLET | ORAL | 1 refills | Status: DC
Start: 2023-06-28 — End: 2023-10-25

## 2023-06-28 MED ORDER — METOPROLOL SUCCINATE ER 50 MG PO TB24: 50.0000 mg | ORAL_TABLET | Freq: Every day | ORAL | 1 refills | Status: DC

## 2023-06-28 NOTE — Progress Notes (Signed)
06/28/2023 Name: Gary Barnett MRN: [id number] DOB: [birth year]  Chief Complaint  Patient presents with   Medication Management   Diabetes    Gary Barnett is a 72 y.o. year old male who presented for a telephone visit.   They were referred to the pharmacist by their PCP for assistance in managing medication access and complex medication management.    Subjective:  Care Team: Primary Care Provider: Sharlene Dory, DO ; Next Scheduled Visit: 07/24/2023 Neurology - Dr Marjory Lies - initial appointment 08/12/2023  Medication Access/Adherence  Current Pharmacy:  [facility] - [place], [place] - [address] [place] [address] Phone: [phone] Fax: [phone]  [id number] - [place], [place] - [address] AT [address] [place] [address] Phone: [phone] Fax: [phone]  [facility] - [place], [place] - [address] [place] [address] Phone: [phone] Fax: [phone]   Patient reports affordability concerns with their medications: Yes  -  approved for LIS for rest of 2024 Patient reports access/transportation concerns to their pharmacy: No  Patient reports adherence concerns with their medications:  No      Diabetes: 04/17/2023 - A1c increased from 7.1% to 7.9%  Current medications: Farxiga 10mg  daily, metformin 1000mg  twice a day and repaglinide 1mg  3 times a day. Rybelsus 7mg  (started with 3mg  for 1 month 04/17/2023)  Discussed administration recommendations for Rybelsus (Administer at least 30 minutes before the first food, beverage or other oral medications of the day, and with no more than 4 ounces of plain water). Per Mrs. Schmock he has been taking in morning with other medications.  Medications tried in the past: pioglitazone - stopped due to edema; glipizide  Current glucose readings: variable per patient's wife 120's to  180's testing 1 time daily  Mrs. Shire reports that patient has been having unusual dreams recently and feels that it might be related to Rybelsus.   She also asks if there is an alternative to Rybelsus that has benefits for his blood glucose and heart.    Patient denies hypoglycemic s/sx including no dizziness, shakiness, sweating. Patient denies hyperglycemic symptoms including no polyuria, polydipsia, polyphagia, nocturia, neuropathy, blurred vision.  [place] but he is now able to get for $11.20 / 90 days since he was approved for LIS / Medicare Extra Help  Hyperlipidemia/ASCVD Risk Reduction Patient has history of stents.  Current lipid lowering medications:  atorvastatin 80mg  daily   Antiplatelet regimen: Brilinta 90 mg twice a day  [place] but he is now able to get for $11.20 / 90 days since he was approved for LIS / Medicare Extra Help   Medication Management:  Looks like patient is due for refills for metformin and pantoprazole Also will need updated prescriptions for metoprolol, repaglinide, atorvastatin and lisinopril - LR was 04/17/2023 for 90 DS / no RF left on current prescriptions at [facility]  Objective:  Lab Results  Component Value Date   HGBA1C 7.9 (H) 04/17/2023    Lab Results  Component Value Date   CREATININE 0.88 04/17/2023   BUN 16 04/17/2023   NA 139 04/17/2023   K 4.7 04/17/2023   CL 102 04/17/2023   CO2 25 04/17/2023    Lab Results  Component Value Date   CHOL 103 04/17/2023  HDL 29.60 (L) 04/17/2023   LDLCALC 35 04/17/2023   TRIG 193.0 (H) 04/17/2023   CHOLHDL 3 04/17/2023    Medications Reviewed Today     Reviewed by Henrene Pastor, RPH-CPP (Pharmacist) on 06/28/23 at 1106  Med List Status: <None>   Medication Order Taking? Sig Documenting Provider Last Dose Status Informant  Accu-Chek FastClix Lancets MISC [id number] Yes Use daily  to check blood sugar three times daily.  DXE11.9 Sharlene Dory, DO Taking Active   Ascorbic Acid (VITAMIN C) 1000 MG tablet [id number] Yes Take 1,000 mg by mouth daily. [provider] Taking Active   aspirin EC 81 MG tablet [id number] Yes Take 1 tablet (81 mg total) by mouth daily. Swallow whole. Revankar, Aundra Dubin, MD Taking Active   atorvastatin (LIPITOR) 80 MG tablet [id number] Yes Take 1 tablet (80 mg total) by mouth daily. Sharlene Dory, DO Taking Active   BRILINTA 90 MG TABS tablet [id number] Yes Take 1 tablet (90 mg total) by mouth 2 (two) times daily. Sharlene Dory, DO Taking Active   citalopram (CELEXA) 40 MG tablet [id number] Yes Take 1 tablet (40 mg total) by mouth daily. Sharlene Dory, DO Taking Active   clonazePAM (KLONOPIN) 0.5 MG tablet [id number] Yes Take 0.5-1 tablets (0.25-0.5 mg total) by mouth 2 (two) times daily as needed for anxiety. Sharlene Dory, DO Taking Active   cyanocobalamin (VITAMIN B12) 1000 MCG tablet [id number] Yes Take 1,000 mcg by mouth daily. [provider] Taking Active   dapagliflozin propanediol (FARXIGA) 10 MG TABS tablet [id number] Yes Take 1 tablet (10 mg total) by mouth daily before breakfast. Sharlene Dory, DO Taking Active   glucose blood (ACCU-CHEK AVIVA PLUS) test strip [id number] Yes Use three days to check blood sugar.  DX E11.9 Sharlene Dory, DO Taking Active   levocetirizine (XYZAL) 5 MG tablet [id number] Yes Take 1 tablet (5 mg total) by mouth every evening. Sharlene Dory, DO Taking Active   lisinopril (ZESTRIL) 5 MG tablet [id number] Yes Take 1 tablet (5 mg total) by mouth daily. Sharlene Dory, DO Taking Active   metFORMIN (GLUCOPHAGE) 1000 MG tablet [id number] Yes Take 1 tablet (1,000 mg total) by mouth 2 (two) times daily with a meal. Sharlene Dory, DO Taking Active   metoprolol succinate (TOPROL-XL) 50 MG 24 hr tablet [id number] Yes Take 1  tablet (50 mg total) by mouth daily. Take with or immediately following a meal. Sharlene Dory, DO Taking Active   montelukast (SINGULAIR) 10 MG tablet [id number] Yes Take 1 tablet (10 mg total) by mouth at bedtime. Sharlene Dory, DO Taking Active   nitroGLYCERIN (NITROSTAT) 0.4 MG SL tablet [id number] Yes DISSOLVE ONE TABLET UNDER TONGUE AS NEEDED FOR CHEST PAIN. EVERY 5(FIVE) MINUTES FOR 3 DOSES Carmelia Roller Jilda Roche, DO Taking Active   pantoprazole (PROTONIX) 20 MG tablet [id number] Yes Take 1 tablet (20 mg total) by mouth daily. Sharlene Dory, DO Taking Active   QUEtiapine (SEROQUEL) 25 MG tablet [id number] Yes Take 1 tablet (25 mg total) by mouth at bedtime. Sharlene Dory, DO Taking Active   repaglinide (PRANDIN) 1 MG tablet [id number] Yes TAKE 1 TABLET(1 MG) BY MOUTH THREE TIMES DAILY BEFORE MEALS Sharlene Dory, DO Taking Active   Semaglutide 7 MG TABS [id number] Yes Take 1 tablet (7 mg total) by mouth daily. Sharlene Dory, DO Taking Active   Zinc 50 MG TABS [id number] Yes Take 1 tablet by mouth daily. [provider] Taking Active  Assessment/Plan:   Diabetes: Last A1c was 7.1% - good given ASCVD history (goal A1c < 7.5%) - Reviewed goal A1c, goal fasting, and goal 2 hour post prandial glucose - Recommend to continue current regimen for diabetes - Farxiga, metformin and repaglinide  - Discussed proper administration for Rybelsus - take 30 minutes prior to meals, drink or other medications. Taking with a sip of water / 4 ounces or less.  - Checked to see if Rybelsus could be cause of sleep disturbance. There have been some reports of sleep disturbance with GLP1 type agents but no incidence available only total psychiatric events. Psychiatric events overall accounted for 4.55% of all adverse event reports associated with GLP-1 receptor agonists collected from 2004 to 2023 (8240 of 181,238). Psychiatric event  reporting by various GLP-1 agonists was as follows; exenatide (n=3948; 47.9%), liraglutide (n=1152; 13.98%), lixisenatide (n=12; 0.15%), dulaglutide (n=1833; 22.25%), semaglutide (n=1033; 12.54%), and tirzepatide (n=262; 3.18%).  - Other options to replace Rybelsus are Trulicity, Ozempic (though is the same compound just different form) or Mounjaro. Will discuss with PCP.  - Recommend continue to check glucose daily and record  Hyperlipidemia/ASCVD Risk Reduction:Currently controlled. LDL goal < 55 - Reviewed long term complications of uncontrolled cholesterol - Recommend to continue atorvastatin and Brilinta  Medication management:  Reviewed medication list and updated Reviewed refill history and discussed adherence.  Requested RF from [facility] for metformin and pantoprazole at Mr and Mrs. Hun's request  Meds ordered this encounter  Medications   atorvastatin (LIPITOR) 80 MG tablet    Sig: Take 1 tablet (80 mg total) by mouth daily.    Dispense:  90 tablet    Refill:  1   metoprolol succinate (TOPROL-XL) 50 MG 24 hr tablet    Sig: Take 1 tablet (50 mg total) by mouth daily. Take with or immediately following a meal.    Dispense:  90 tablet    Refill:  1   lisinopril (ZESTRIL) 5 MG tablet    Sig: Take 1 tablet (5 mg total) by mouth daily.    Dispense:  90 tablet    Refill:  1   repaglinide (PRANDIN) 1 MG tablet    Sig: TAKE 1 TABLET(1 MG) BY MOUTH THREE TIMES DAILY BEFORE MEALS    Dispense:  270 tablet    Refill:  1    Follow Up Plan: 3 months; will see PCP 07/24/2023  Henrene Pastor, PharmD Clinical Pharmacist [facility]

## 2023-07-05 ENCOUNTER — Other Ambulatory Visit: Payer: Self-pay | Admitting: Family Medicine

## 2023-07-11 ENCOUNTER — Telehealth: Payer: Self-pay | Admitting: Family Medicine

## 2023-07-11 NOTE — Telephone Encounter (Signed)
Pt's wife requested to speak with Tammy to discuss pt's medication, BRILINTA 90 MG TABS tablet. Please call & advise pt's wife.

## 2023-07-11 NOTE — Telephone Encounter (Signed)
Called patient back. She has been receiving text from [facility] and Me to re-enroll for next year for [place]

## 2023-07-11 NOTE — Telephone Encounter (Signed)
Called [name] back. They requested phone appt for tomorrow to discuss medicare and medications.  Appt made for 2pm 09/06

## 2023-07-12 ENCOUNTER — Other Ambulatory Visit: Payer: Medicare Other | Admitting: Pharmacist

## 2023-07-12 NOTE — Progress Notes (Signed)
07/12/2023 Name: Gary Barnett MRN: [id number] DOB: 30-Mar-1951  Chief Complaint  Patient presents with   Medication Management    Follow up    Gary Barnett is a 72 y.o. year old male who presented for a telephone visit.   They were referred to the pharmacist by their PCP for assistance in managing medication access and complex medication management.  Patient's wife called with questions about Medicare Extra Help status. She was wondering if their premium for PartB should be covered.    Subjective:  Care Team: Primary Care Provider: Sharlene Dory, DO ; Next Scheduled Visit: 07/24/2023 Neurology - Dr Marjory Lies - initial appointment 08/12/2023  Medication Access/Adherence  Current Pharmacy:  [facility] - [place], [place] - [address] [place] [address] Phone: [phone] Fax: [phone]  [id number] - [place], [place] - [address] AT [address] [place] [address] Phone: [phone] Fax: [phone]  [facility] - [place], [place] - [address]. [address] [place] [address] Phone: [phone] Fax: [phone]   Patient reports affordability concerns with their medications: Yes  -  approved for LIS for rest of 2024 Patient reports access/transportation concerns to their pharmacy: No  Patient reports adherence concerns with their medications:  No      Diabetes: 04/17/2023 - A1c increased from 7.1% to 7.9%  Current medications: Farxiga 10mg  daily, metformin 1000mg  twice a day and repaglinide 1mg  3 times a day. Rybelsus 7mg  (started with 3mg  for 1 month on 04/17/2023)   Medications tried in the past: pioglitazone - stopped due to edema; glipizide  Current glucose readings: readings have been 120 to 175 testing 1 time daily  At our last visit Gary Barnett had asked about alternatives to Rybelsus that has benefits for his blood glucose and heart.  Dr Carmelia Roller is planning to discuss possible change to Ozmepic or Mounjaro at next office visit 07/24/2023   Patient denies hypoglycemic s/sx including no dizziness, shakiness, sweating. Patient denies hyperglycemic symptoms including no polyuria, polydipsia, polyphagia, nocturia, neuropathy, blurred vision.  [place] but he is now able to get for $11.20 / 90 days since he was approved for Medicare Extra Help  Hyperlipidemia/ASCVD Risk Reduction Patient has history of stents.  Current lipid lowering medications:  atorvastatin 80mg  daily   Antiplatelet regimen: Brilinta 90 mg twice a day  [place] but he is now able to get for $11.20 / 90 days since he was approved for Medicare Extra Help   Objective:  Lab Results  Component Value Date   HGBA1C 7.9 (H) 04/17/2023    Lab Results  Component Value Date   CREATININE 0.88 04/17/2023   BUN 16 04/17/2023   NA 139 04/17/2023   K 4.7 04/17/2023   CL 102 04/17/2023   CO2 25 04/17/2023    Lab Results  Component Value Date   CHOL 103 04/17/2023   HDL 29.60 (L) 04/17/2023   LDLCALC 35 04/17/2023   TRIG 193.0 (H) 04/17/2023   CHOLHDL 3 04/17/2023    Medications Reviewed Today     Reviewed by Henrene Pastor, RPH-CPP (Pharmacist) on 07/12/23 at 1439  Med List Status: <None>   Medication Order Taking? Sig Documenting Provider Last Dose Status Informant  Accu-Chek FastClix Lancets MISC [id number] No Use daily to check  blood sugar three times daily.  DXE11.9 Sharlene Dory, DO Taking Active   Ascorbic Acid (VITAMIN C) 1000 MG tablet [id number] No Take 1,000 mg by mouth daily. [provider] Taking Active   aspirin EC 81 MG tablet [id number] No Take 1 tablet (81 mg total) by mouth daily. Swallow whole. Revankar, Aundra Dubin, MD Taking Active   atorvastatin (LIPITOR) 80 MG tablet [id number]  Take 1 tablet (80 mg total) by mouth  daily. Sharlene Dory, DO  Active   BRILINTA 90 MG TABS tablet [id number] No Take 1 tablet (90 mg total) by mouth 2 (two) times daily. Sharlene Dory, DO Taking Active   citalopram (CELEXA) 40 MG tablet [id number] No Take 1 tablet (40 mg total) by mouth daily. Sharlene Dory, DO Taking Active   clonazePAM (KLONOPIN) 0.5 MG tablet [id number] No Take 0.5-1 tablets (0.25-0.5 mg total) by mouth 2 (two) times daily as needed for anxiety. Sharlene Dory, DO Taking Active   cyanocobalamin (VITAMIN B12) 1000 MCG tablet [id number] No Take 1,000 mcg by mouth daily. [provider] Taking Active   dapagliflozin propanediol (FARXIGA) 10 MG TABS tablet [id number] No Take 1 tablet (10 mg total) by mouth daily before breakfast. Sharlene Dory, DO Taking Active   glucose blood (ACCU-CHEK AVIVA PLUS) test strip [id number] No Use three days to check blood sugar.  DX E11.9 Sharlene Dory, DO Taking Active   levocetirizine (XYZAL) 5 MG tablet [id number] No Take 1 tablet (5 mg total) by mouth every evening. Sharlene Dory, DO Taking Active   lisinopril (ZESTRIL) 5 MG tablet [id number]  Take 1 tablet (5 mg total) by mouth daily. Sharlene Dory, DO  Active   metFORMIN (GLUCOPHAGE) 1000 MG tablet [id number] No Take 1 tablet (1,000 mg total) by mouth 2 (two) times daily with a meal. Sharlene Dory, DO Taking Active   metoprolol succinate (TOPROL-XL) 50 MG 24 hr tablet [id number]  Take 1 tablet (50 mg total) by mouth daily. Take with or immediately following a meal. Sharlene Dory, DO  Active   montelukast (SINGULAIR) 10 MG tablet [id number] No Take 1 tablet (10 mg total) by mouth at bedtime. Sharlene Dory, DO Taking Active   nitroGLYCERIN (NITROSTAT) 0.4 MG SL tablet [id number] No DISSOLVE ONE TABLET UNDER TONGUE AS NEEDED FOR CHEST PAIN. EVERY 5(FIVE) MINUTES FOR 3 DOSES Carmelia Roller Jilda Roche, DO Taking Active   pantoprazole  (PROTONIX) 20 MG tablet [id number] No Take 1 tablet (20 mg total) by mouth daily. Sharlene Dory, DO Taking Active   QUEtiapine (SEROQUEL) 25 MG tablet [id number] No Take 1 tablet (25 mg total) by mouth at bedtime. Sharlene Dory, DO Taking Active   repaglinide (PRANDIN) 1 MG tablet [id number]  TAKE 1 TABLET(1 MG) BY MOUTH THREE TIMES DAILY BEFORE MEALS Sharlene Dory, DO  Active   RYBELSUS 7 MG TABS [id number]  TAKE 1 TABLET(7 MG) BY MOUTH DAILY AS DIRECTED Sharlene Dory, DO  Active   Zinc 50 MG TABS [id number] No Take 1 tablet by mouth daily. [provider] Taking Active               Assessment/Plan:   Diabetes: Last A1c was 7.9% was not at goal - given ASCVD history (goal A1c < 7.5%) - Recommend to continue current regimen for diabetes - Rybelsus, Farxiga, metformin and repaglinide  - Discussed proper administration for Rybelsus - take 30 minutes prior to meals, drink or other medications. Taking with  a sip of water / 4 ounces or less.  - patient to see Dr Carmelia Roller 07/24/2023 to discuss changing Rybelsus to either Ozempic or Mounjaro.   - Recommend continue to check glucose daily and record  Hyperlipidemia/ASCVD Risk Reduction:Currently controlled. LDL goal < 55 - Reviewed long term complications of uncontrolled cholesterol - Recommend to continue atorvastatin and Brilinta  Medication management:  Reviewed medication list and updated Reviewed refill history and discussed adherence.   Assisted patient's wife Gary Barnett in contacting [facility] office in [place] to ask about Medicare Extra Help / LIS benefits for premium reduction / coverage. Unfortunately the counselors are not there after 1pm on Fridays. Left message with [facility].  They will call patient and Gary Barnett back Monday or Tuesday of next week.   [facility] [address] [place]  [address] [phone]       Follow Up Plan: 3 months; will see PCP 07/24/2023  Henrene Pastor, PharmD Clinical Pharmacist [facility]

## 2023-07-24 ENCOUNTER — Ambulatory Visit (INDEPENDENT_AMBULATORY_CARE_PROVIDER_SITE_OTHER): Payer: Medicare Other | Admitting: Family Medicine

## 2023-07-24 ENCOUNTER — Encounter: Payer: Self-pay | Admitting: Family Medicine

## 2023-07-24 VITALS — BP 120/80 | HR 61 | Temp 98.0°F | Ht 71.0 in | Wt 195.1 lb

## 2023-07-24 DIAGNOSIS — E1165 Type 2 diabetes mellitus with hyperglycemia: Secondary | ICD-10-CM | POA: Diagnosis not present

## 2023-07-24 DIAGNOSIS — Z7984 Long term (current) use of oral hypoglycemic drugs: Secondary | ICD-10-CM

## 2023-07-24 DIAGNOSIS — E782 Mixed hyperlipidemia: Secondary | ICD-10-CM | POA: Diagnosis not present

## 2023-07-24 LAB — COMPREHENSIVE METABOLIC PANEL WITH GFR
ALT: 15 U/L (ref 0–53)
AST: 12 U/L (ref 0–37)
Albumin: 4.4 g/dL (ref 3.5–5.2)
Alkaline Phosphatase: 113 U/L (ref 39–117)
BUN: 22 mg/dL (ref 6–23)
CO2: 29 meq/L (ref 19–32)
Calcium: 8.9 mg/dL (ref 8.4–10.5)
Chloride: 101 meq/L (ref 96–112)
Creatinine, Ser: 1 mg/dL (ref 0.40–1.50)
GFR: 76.56 mL/min (ref >60.00)
Glucose, Bld: 112 mg/dL — ABNORMAL HIGH (ref 70–99)
Potassium: 4.1 meq/L (ref 3.5–5.1)
Sodium: 140 meq/L (ref 135–145)
Total Bilirubin: 1.1 mg/dL (ref 0.2–1.2)
Total Protein: 6.5 g/dL (ref 6.0–8.3)

## 2023-07-24 LAB — LIPID PANEL
Cholesterol: 101 mg/dL (ref 0–200)
HDL: 33.5 mg/dL — ABNORMAL LOW (ref >39.00)
LDL Cholesterol: 38 mg/dL (ref 0–99)
NonHDL: 67.79
Total CHOL/HDL Ratio: 3
Triglycerides: 149 mg/dL (ref 0.0–149.0)
VLDL: 29.8 mg/dL (ref 0.0–40.0)

## 2023-07-24 LAB — HEMOGLOBIN A1C: Hgb A1c MFr Bld: 8.1 % — ABNORMAL HIGH (ref 4.6–6.5)

## 2023-07-24 MED ORDER — TIRZEPATIDE 10 MG/0.5ML ~~LOC~~ SOAJ: 10.0000 mg | SUBCUTANEOUS | 0 refills | Status: AC

## 2023-07-24 MED ORDER — TIRZEPATIDE 12.5 MG/0.5ML ~~LOC~~ SOPN
12.5000 mg | PEN_INJECTOR | SUBCUTANEOUS | 0 refills | Status: AC
Start: 2023-09-18 — End: 2023-10-16

## 2023-07-24 MED ORDER — TIRZEPATIDE 7.5 MG/0.5ML ~~LOC~~ SOAJ: 7.5000 mg | SUBCUTANEOUS | 0 refills | Status: AC

## 2023-07-24 MED ORDER — TIRZEPATIDE 15 MG/0.5ML ~~LOC~~ SOAJ
15.0000 mg | SUBCUTANEOUS | 1 refills | Status: DC
Start: 2023-10-16 — End: 2023-10-29

## 2023-07-24 NOTE — Progress Notes (Signed)
Subjective:   Chief Complaint  Patient presents with   Follow-up    3 month Discuss diabetes medication     Gary Barnett is a 72 y.o. male here for follow-up of diabetes.   [name]'s self monitored glucose range is 80-mid 100's.  Patient denies hypoglycemic reactions. He checks his glucose levels 2 time(s) per day. Patient does not require insulin.   Medications include: Rybelsus 7 mg/d, Prandin 1 mg TID, metformin 1000 mg bid, Farxiga 10 mg/d Diet is pretty healthy.  Exercise: walking  Hyperlipidemia Patient presents for mixed hyperlipidemia follow up. Currently being treated with Lipitor 80 mg/d and compliance with treatment thus far has been good. He denies myalgias. Diet/exercise as above.  The patient is known to have coexisting coronary artery disease. No CP or SOB.   Past Medical History:  Diagnosis Date   Anxiety 11/11/2017   Benign prostatic hyperplasia 04/13/2014   Formatting of this note might be different from the original. 10/1 IMO update   CAD (coronary artery disease) 09/30/2015   Change in bowel habits 06/20/2016   Diabetes mellitus due to underlying condition with unspecified complications (HCC) 06/18/2017   Diabetes mellitus without complication (HCC)    Encounter for colonoscopy due to history of adenomatous colonic polyps 05/20/2014   Essential hypertension 08/09/2014   GAD (generalized anxiety disorder) 05/04/2020   Hyperlipemia 08/09/2014   Hyperlipidemia    Hypertension    Insomnia 06/03/2019   Major depressive disorder with single episode, in full remission (HCC) 11/11/2017   Malaise 03/20/2018   Primary osteoarthritis involving multiple joints 11/11/2017   Risk for falls 01/23/2016   Screening for malignant neoplasm of prostate 03/20/2018   Seasonal allergies 08/03/2021   Sigmoid diverticulosis 07/08/2014   Type 2 diabetes mellitus with hyperglycemia, without long-term current use of insulin (HCC) 11/21/2015   Vitamin D deficiency 08/09/2014     Related  testing: Retinal exam: Done Pneumovax: done  Objective:  BP 120/80 (BP Location: Left Arm, Patient Position: Sitting, Cuff Size: Normal)   Pulse 61   Temp 98 F (36.7 C) (Oral)   Ht 5\' 11"  (1.803 m)   Wt 195 lb 2 oz (88.5 kg)   SpO2 99%   BMI 27.21 kg/m  General:  Well developed, well nourished, in no apparent distress Lungs:  CTAB, no access msc use Cardio:  RRR, no bruits, no LE edema Psych: Age appropriate judgment and insight  Assessment:   Type 2 diabetes mellitus with hyperglycemia, without long-term current use of insulin (HCC) - Plan: tirzepatide (MOUNJARO) 7.5 MG/0.5ML Pen, tirzepatide (MOUNJARO) 10 MG/0.5ML Pen, tirzepatide (MOUNJARO) 12.5 MG/0.5ML Pen, tirzepatide (MOUNJARO) 15 MG/0.5ML Pen, Hemoglobin A1c, Lipid panel, Hemoglobin A1c, Comprehensive metabolic panel  Mixed hyperlipidemia   Plan:   Chronic, unsure if stable. Change Rybelsus to Mounjaro 7.5 mg/week and up-titrate. Let me know if there are cost issues. Counseled on diet and exercise. Cont Farxiga 10 mg/d, metformin 1000 mg bid, Prandin 1 mg TID.  Chronic, unsure if stable. Cont Lipitor 80 mg/d.  F/u in 3 mo. The patient voiced understanding and agreement to the plan.  Jilda Roche [place], DO 07/24/23 11:08 AM

## 2023-07-24 NOTE — Patient Instructions (Addendum)
Take the Rybelsus the day before you start the [facility].   Keep the diet clean and stay active.  Give [place] 2-3 business days to get the results of your labs back.   Let us know if you need anything.

## 2023-08-06 ENCOUNTER — Ambulatory Visit: Payer: Medicare Other | Admitting: Neurology

## 2023-08-07 DIAGNOSIS — L720 Epidermal cyst: Secondary | ICD-10-CM | POA: Diagnosis not present

## 2023-08-07 DIAGNOSIS — D2271 Melanocytic nevi of right lower limb, including hip: Secondary | ICD-10-CM | POA: Diagnosis not present

## 2023-08-07 DIAGNOSIS — D225 Melanocytic nevi of trunk: Secondary | ICD-10-CM | POA: Diagnosis not present

## 2023-08-07 DIAGNOSIS — D2372 Other benign neoplasm of skin of left lower limb, including hip: Secondary | ICD-10-CM | POA: Diagnosis not present

## 2023-08-12 ENCOUNTER — Ambulatory Visit: Payer: Medicare Other | Admitting: Diagnostic Neuroimaging

## 2023-08-12 ENCOUNTER — Encounter: Payer: Self-pay | Admitting: Diagnostic Neuroimaging

## 2023-08-12 VITALS — BP 108/72 | HR 78 | Ht 72.0 in | Wt 194.0 lb

## 2023-08-12 DIAGNOSIS — R413 Other amnesia: Secondary | ICD-10-CM | POA: Diagnosis not present

## 2023-08-12 NOTE — Patient Instructions (Signed)
  MILD MEMORY LOSS - check MRI brain, ATN panel - safety / supervision issues reviewed - daily physical activity / exercise (at least 15-30 minutes) - eat more plants / vegetables - increase social activities, brain stimulation, games, puzzles, hobbies, crafts, arts, music - aim for at least 7-8 hours sleep per night (or more) - avoid smoking and alcohol - caution with medications, finances, driving

## 2023-08-12 NOTE — Progress Notes (Signed)
[facility]  PATIENT: Gary Barnett DOB: 1951-04-25  REFERRING CLINICIAN: Sharlene Dory* HISTORY FROM: patient  REASON FOR VISIT: new consult   HISTORICAL  CHIEF COMPLAINT:  Chief Complaint  Patient presents with   New Patient (Initial Visit)    Patient in room #6 with his wife. Patient wife states he been having memory issues.    HISTORY OF PRESENT ILLNESS:   72 year old male here for evaluation memory loss.  Patient denies any memory problems.  Patient's wife has noted some mild short-term memory problems, forgetting things, losing objects, mild difficulty driving directions over the past 1 year.  Patient also has long history of anxiety insomnia on citalopram and quetiapine, with mild benefit.  No major changes in ADLs.    REVIEW OF SYSTEMS: Full 14 system review of systems performed and negative with exception of: as per HPI.  ALLERGIES: Allergies  Allergen Reactions   Clopidogrel Hives and Itching    HOME MEDICATIONS: Outpatient Medications Prior to Visit  Medication Sig Dispense Refill   Accu-Chek FastClix Lancets MISC Use daily to check blood sugar three times daily.  DXE11.9 306 each 3   Ascorbic Acid (VITAMIN C) 1000 MG tablet Take 1,000 mg by mouth daily.     aspirin EC 81 MG tablet Take 1 tablet (81 mg total) by mouth daily. Swallow whole. 90 tablet 3   atorvastatin (LIPITOR) 80 MG tablet Take 1 tablet (80 mg total) by mouth daily. 90 tablet 1   BRILINTA 90 MG TABS tablet Take 1 tablet (90 mg total) by mouth 2 (two) times daily. 180 tablet 2   calcium-vitamin D (OSCAL WITH D) 500-5 MG-MCG tablet Take 1 tablet by mouth.     citalopram (CELEXA) 40 MG tablet Take 1 tablet (40 mg total) by mouth daily. 90 tablet 3   cyanocobalamin (VITAMIN B12) 1000 MCG tablet Take 1,000 mcg by mouth daily.     dapagliflozin propanediol (FARXIGA) 10 MG TABS tablet Take 1 tablet (10 mg total) by mouth daily before breakfast. 90 tablet 2   glucose  blood (ACCU-CHEK AVIVA PLUS) test strip Use three days to check blood sugar.  DX E11.9 300 each 3   ibuprofen (ADVIL) 200 MG tablet Take 200 mg by mouth every 6 (six) hours as needed.     levocetirizine (XYZAL) 5 MG tablet Take 1 tablet (5 mg total) by mouth every evening. 90 tablet 3   lisinopril (ZESTRIL) 5 MG tablet Take 1 tablet (5 mg total) by mouth daily. 90 tablet 1   metFORMIN (GLUCOPHAGE) 1000 MG tablet Take 1 tablet (1,000 mg total) by mouth 2 (two) times daily with a meal. 180 tablet 3   metoprolol succinate (TOPROL-XL) 50 MG 24 hr tablet Take 1 tablet (50 mg total) by mouth daily. Take with or immediately following a meal. 90 tablet 1   montelukast (SINGULAIR) 10 MG tablet Take 1 tablet (10 mg total) by mouth at bedtime. 90 tablet 3   nitroGLYCERIN (NITROSTAT) 0.4 MG SL tablet DISSOLVE ONE TABLET UNDER TONGUE AS NEEDED FOR CHEST PAIN. EVERY 5(FIVE) MINUTES FOR 3 DOSES 25 tablet 0   pantoprazole (PROTONIX) 20 MG tablet Take 1 tablet (20 mg total) by mouth daily. 90 tablet 3   QUEtiapine (SEROQUEL) 25 MG tablet Take 1 tablet (25 mg total) by mouth at bedtime. 90 tablet 3   repaglinide (PRANDIN) 1 MG tablet TAKE 1 TABLET(1 MG) BY MOUTH THREE TIMES DAILY BEFORE MEALS 270 tablet 1   RYBELSUS 7 MG TABS  Take 1 tablet by mouth daily.     [START ON 08/21/2023] tirzepatide (MOUNJARO) 10 MG/0.5ML Pen Inject 10 mg into the skin once a week for 28 days. 2 mL 0   [START ON 09/18/2023] tirzepatide (MOUNJARO) 12.5 MG/0.5ML Pen Inject 12.5 mg into the skin once a week for 28 days. 2 mL 0   [START ON 10/16/2023] tirzepatide (MOUNJARO) 15 MG/0.5ML Pen Inject 15 mg into the skin once a week. 6 mL 1   tirzepatide (MOUNJARO) 7.5 MG/0.5ML Pen Inject 7.5 mg into the skin once a week for 28 days. 2 mL 0   Zinc 50 MG TABS Take 1 tablet by mouth daily.     clonazePAM (KLONOPIN) 0.5 MG tablet Take 0.5-1 tablets (0.25-0.5 mg total) by mouth 2 (two) times daily as needed for anxiety. (Patient not taking: Reported on  08/12/2023) 30 tablet 2   No facility-administered medications prior to visit.    PAST MEDICAL HISTORY: Past Medical History:  Diagnosis Date   Anxiety 11/11/2017   Benign prostatic hyperplasia 04/13/2014   Formatting of this note might be different from the original. 10/1 IMO update   CAD (coronary artery disease) 09/30/2015   Change in bowel habits 06/20/2016   Diabetes mellitus due to underlying condition with unspecified complications (HCC) 06/18/2017   Diabetes mellitus without complication (HCC)    Encounter for colonoscopy due to history of adenomatous colonic polyps 05/20/2014   Essential hypertension 08/09/2014   GAD (generalized anxiety disorder) 05/04/2020   Hyperlipemia 08/09/2014   Hyperlipidemia    Hypertension    Insomnia 06/03/2019   Major depressive disorder with single episode, in full remission (HCC) 11/11/2017   Malaise 03/20/2018   Primary osteoarthritis involving multiple joints 11/11/2017   Risk for falls 01/23/2016   Screening for malignant neoplasm of prostate 03/20/2018   Seasonal allergies 08/03/2021   Sigmoid diverticulosis 07/08/2014   Type 2 diabetes mellitus with hyperglycemia, without long-term current use of insulin (HCC) 11/21/2015   Vitamin D deficiency 08/09/2014    PAST SURGICAL HISTORY: Past Surgical History:  Procedure Laterality Date   CORONARY STENT PLACEMENT     Thinks left main, around age 49    FAMILY HISTORY: Family History  Problem Relation Age of Onset   Heart attack Brother    Heart disease Brother    Hyperlipidemia Mother    Hypertension Father     SOCIAL HISTORY: Social History   Socioeconomic History   Marital status: Married    Spouse name: Not on file   Number of children: Not on file   Years of education: Not on file   Highest education level: Not on file  Occupational History   Not on file  Tobacco Use   Smoking status: Former    Current packs/day: 0.00    Average packs/day: 1 pack/day for 25.0 years (25.0 ttl pk-yrs)     Types: Cigarettes    Start date: 34    Quit date: 1994    Years since quitting: 30.7   Smokeless tobacco: Never  Vaping Use   Vaping status: Never Used  Substance and Sexual Activity   Alcohol use: No   Drug use: No   Sexual activity: Not on file  Other Topics Concern   Not on file  Social History Narrative   Not on file   Social Determinants of Health   Financial Resource Strain: Low Risk  (12/07/2022)   Received from [facility], [facility]   Overall Financial Resource Strain (CARDIA)  Difficulty of Paying Living Expenses: Not hard at all  Food Insecurity: No Food Insecurity (12/07/2022)   Received from [facility], [facility]   Hunger Vital Sign    Worried About Running Out of Food in the Last Year: Never true    Ran Out of Food in the Last Year: Never true  Transportation Needs: No Transportation Needs (12/07/2022)   Received from [facility], [facility]   [facility] - Transportation    Lack of Transportation (Medical): No    Lack of Transportation (Non-Medical): No  Physical Activity: Not on file  Stress: No Stress Concern Present (06/26/2022)   Received from [facility], [facility] - Occupational Stress Questionnaire    Feeling of Stress : Not at all  Social Connections: Unknown (03/09/2022)   Received from [facility], [facility]   Social Network    Social Network: Not on file  Intimate Partner Violence: Unknown (02/07/2022)   Received from [facility], [facility]   HITS    Physically Hurt: Not on file    Insult or Talk Down To: Not on file    Threaten Physical Harm: Not on file    Scream or Curse: Not on file     PHYSICAL EXAM  GENERAL EXAM/CONSTITUTIONAL: Vitals:  Vitals:   08/12/23 1019  BP: 108/72  Pulse: 78  Weight: 194 lb (88 kg)  Height: 6' (1.829 m)   Body mass index is 26.31 kg/m. Wt Readings from Last 3 Encounters:  08/12/23 194 lb (88 kg)  07/24/23 195 lb 2 oz (88.5 kg)   04/17/23 192 lb 6 oz (87.3 kg)   Patient is in no distress; well developed, nourished and groomed; neck is supple  CARDIOVASCULAR: Examination of carotid arteries is normal; no carotid bruits Regular rate and rhythm, no murmurs Examination of peripheral vascular system by observation and palpation is normal  EYES: Ophthalmoscopic exam of optic discs and posterior segments is normal; no papilledema or hemorrhages No results found.  MUSCULOSKELETAL: Gait, strength, tone, movements noted in Neurologic exam below  NEUROLOGIC: MENTAL STATUS:     08/12/2023   10:25 AM  MMSE - Mini Mental State Exam  Orientation to time 5  Orientation to Place 5  Registration 3  Attention/ Calculation 5  Recall 3  Language- name 2 objects 2  Language- repeat 1  Language- follow 3 step command 3  Language- read & follow direction 1  Write a sentence 1  Copy design 0  Total score 29   awake, alert, oriented to person, place and time recent and remote memory intact normal attention and concentration language fluent, comprehension intact, naming intact fund of knowledge appropriate  CRANIAL NERVE:  2nd - no papilledema on fundoscopic exam 2nd, 3rd, 4th, 6th - pupils equal and reactive to light, visual fields full to confrontation, extraocular muscles intact, no nystagmus 5th - facial sensation symmetric 7th - facial strength symmetric 8th - hearing intact 9th - palate elevates symmetrically, uvula midline 11th - shoulder shrug symmetric 12th - tongue protrusion midline  MOTOR:  normal bulk and tone, full strength in the BUE, BLE  SENSORY:  normal and symmetric to light touch, temperature, vibration  COORDINATION:  finger-nose-finger, fine finger movements normal  REFLEXES:  deep tendon reflexes TRACE and symmetric  GAIT/STATION:  narrow based gait     DIAGNOSTIC DATA (LABS, IMAGING, TESTING) - I reviewed patient records, labs, notes, testing and imaging myself where  available.  Lab Results  Component Value Date   WBC 9.0 11/06/2022   HGB 14.5 11/06/2022   HCT 43.3 11/06/2022   MCV 91.4 11/06/2022   PLT 264.0 11/06/2022      Component Value Date/Time   NA 140 07/24/2023 1110   NA 141 02/16/2020 1139   K 4.1 07/24/2023 1110   CL 101 07/24/2023 1110   CO2 29 07/24/2023 1110   GLUCOSE 112 (H) 07/24/2023 1110   BUN 22 07/24/2023 1110   BUN 15 02/16/2020 1139   CREATININE 1.00 07/24/2023 1110   CREATININE 0.86 08/09/2020 1359   CALCIUM 8.9 07/24/2023 1110   PROT 6.5 07/24/2023 1110   PROT 7.1 02/16/2020 1139   ALBUMIN 4.4 07/24/2023 1110   ALBUMIN 4.8 02/16/2020 1139   AST 12 07/24/2023 1110   ALT 15 07/24/2023 1110   ALKPHOS 113 07/24/2023 1110   BILITOT 1.1 07/24/2023 1110   BILITOT 0.7 02/16/2020 1139   GFRNONAA 90 02/16/2020 1139   GFRAA 104 02/16/2020 1139   Lab Results  Component Value Date   CHOL 101 07/24/2023   HDL 33.50 (L) 07/24/2023   LDLCALC 38 07/24/2023   TRIG 149.0 07/24/2023   CHOLHDL 3 07/24/2023   Lab Results  Component Value Date   HGBA1C 8.1 (H) 07/24/2023   Lab Results  Component Value Date   VITAMINB12 337 11/06/2022   Lab Results  Component Value Date   TSH 1.69 10/09/2022     ASSESSMENT AND PLAN  72 y.o. year old male here with:   Dx:  1. Memory loss     PLAN:  MILD MEMORY LOSS (could represent normal aging, mild cognitive impairment vs mild dementia) - check MRI brain, ATN panel - safety / supervision issues reviewed - daily physical activity / exercise (at least 15-30 minutes) - eat more plants / vegetables - increase social activities, brain stimulation, games, puzzles, hobbies, crafts, arts, music - aim for at least 7-8 hours sleep per night (or more) - avoid smoking and alcohol - caution with medications, finances, driving  Orders Placed This Encounter  Procedures   MR BRAIN WO CONTRAST   ATN PROFILE   Return for pending test results, pending if symptoms worsen or fail to  improve.   Suanne Marker, MD 08/12/2023, 11:07 AM Certified in Neurology, Neurophysiology and Neuroimaging  [facility] [address], [address] [place], [place] [address] [phone]

## 2023-08-13 ENCOUNTER — Other Ambulatory Visit: Payer: Self-pay | Admitting: Family Medicine

## 2023-08-13 DIAGNOSIS — R972 Elevated prostate specific antigen [PSA]: Secondary | ICD-10-CM | POA: Diagnosis not present

## 2023-08-13 DIAGNOSIS — I714 Abdominal aortic aneurysm, without rupture, unspecified: Secondary | ICD-10-CM

## 2023-08-15 ENCOUNTER — Telehealth: Payer: Self-pay | Admitting: Diagnostic Neuroimaging

## 2023-08-15 NOTE — Telephone Encounter (Signed)
[employer] medicare Berkley Harvey: [id number] exp. 08/15/23-09/13/23 sent to GI [phone]

## 2023-08-17 LAB — ATN PROFILE
A -- Beta-amyloid 42/40 Ratio: 0.113 (ref >0.102)
Beta-amyloid 40: 204.97 pg/mL
Beta-amyloid 42: 23.22 pg/mL
N -- NfL, Plasma: 3.01 pg/mL (ref 0.00–4.61)
T -- p-tau181: 0.82 pg/mL (ref 0.00–0.97)

## 2023-08-27 ENCOUNTER — Telehealth: Payer: Self-pay

## 2023-08-27 NOTE — Telephone Encounter (Signed)
Called and LVM per DPR 

## 2023-08-27 NOTE — Telephone Encounter (Signed)
-----   Message from [facility] sent at 08/26/2023  6:24 PM EDT ----- Normal ATN profile. No signs of alzheimer's dementia in this test. Continue current plan. -VRP

## 2023-09-13 ENCOUNTER — Other Ambulatory Visit: Payer: Medicare Other

## 2023-09-20 ENCOUNTER — Other Ambulatory Visit: Payer: Self-pay | Admitting: Pharmacist

## 2023-09-20 ENCOUNTER — Telehealth: Payer: Self-pay | Admitting: Pharmacist

## 2023-09-20 DIAGNOSIS — J302 Other seasonal allergic rhinitis: Secondary | ICD-10-CM

## 2023-09-20 DIAGNOSIS — F411 Generalized anxiety disorder: Secondary | ICD-10-CM

## 2023-09-20 MED ORDER — BRILINTA 90 MG PO TABS: 90.0000 mg | ORAL_TABLET | Freq: Two times a day (BID) | ORAL | 0 refills | Status: DC

## 2023-09-20 MED ORDER — CITALOPRAM HYDROBROMIDE 40 MG PO TABS: 40.0000 mg | ORAL_TABLET | Freq: Every day | ORAL | 0 refills | Status: DC

## 2023-09-20 MED ORDER — DAPAGLIFLOZIN PROPANEDIOL 10 MG PO TABS: 10.0000 mg | ORAL_TABLET | Freq: Every day | ORAL | 0 refills | Status: DC

## 2023-09-20 MED ORDER — MONTELUKAST SODIUM 10 MG PO TABS: 10.0000 mg | ORAL_TABLET | Freq: Every day | ORAL | 0 refills | Status: DC

## 2023-09-20 NOTE — Telephone Encounter (Signed)
I now see that he had another US done at [place] last year. No further follow up required at this point. Sorry for confusion. Plz cancel order. Ty.

## 2023-09-20 NOTE — Progress Notes (Signed)
09/20/2023 Name: Gary Barnett MRN: [id number] DOB: 07/22/1951  Chief Complaint  Patient presents with   Medication Management   Diabetes    Gary Barnett is a 72 y.o. year old male who presented for a telephone visit.   They were referred to the pharmacist by their PCP for assistance in managing medication access and complex medication management.    Subjective:  Care Team: Primary Care Provider: Sharlene Dory, DO ; Next Scheduled Visit: 10/16/2023 Neurology - Dr Marjory Lies  Cardiology - Dr Chales Abrahams [facility])  Medication Access/Adherence  Current Pharmacy:  [facility] - [place], [place] - [address] [facility] [address] [place] [address] Phone: [phone] Fax: [phone]  [id number] - [place], [place] - [address] AT [address] [place] [address] Phone: [phone] Fax: [phone]  [facility] - [place], [place] - [address] [place] [address] Phone: [phone] Fax: [phone]   Patient reports affordability concerns with their medications: Yes  -  approved for LIS for rest of 2024 and should also get for 2025 Patient reports access/transportation concerns to their pharmacy: No  Patient reports adherence concerns with their medications:  No      Diabetes:  Current medications: Farxiga 10mg  daily, metformin 1000mg  twice a day and repaglinide 1mg  3 times a day. Mounjaro started 07/24/2023  Patient is tolerating Mounjaro well. Current dose is 10mg  weekly. Denies nausea or vomiting, no abdominal pain.   Medications tried in the past: pioglitazone - stopped due to edema; glipizide; Rybelsus - did not get A1c to goal  Current glucose readings: readings have been 100 to 140 testing 1 time daily  Patient denies hypoglycemic s/sx including no dizziness, shakiness, sweating. Patient denies hyperglycemic symptoms including no polyuria, polydipsia,  polyphagia, nocturia, neuropathy, blurred vision.  Patient [place] in past but he is now able to get for $11.20 / 90 days since he was approved for Medicare Extra Help  Hyperlipidemia/ASCVD Risk Reduction Patient has history of stents.  Current lipid lowering medications:  atorvastatin 80mg  daily   Antiplatelet regimen: Brilinta 90 mg twice a day  Patient has [place]ilinta [place] in [place] but he is now able to get for $11.20 / 90 days since he was approved for Medicare Extra Help   Objective:  Lab Results  Component Value Date   HGBA1C 8.1 (H) 07/24/2023    Lab Results  Component Value Date   CREATININE 1.00 07/24/2023   BUN 22 07/24/2023   NA 140 07/24/2023   K 4.1 07/24/2023   CL 101 07/24/2023   CO2 29 07/24/2023    Lab Results  Component Value Date   CHOL 101 07/24/2023   HDL 33.50 (L) 07/24/2023   LDLCALC 38 07/24/2023   TRIG 149.0 07/24/2023   CHOLHDL 3 07/24/2023    Medications Reviewed Today     Reviewed by Henrene Pastor, RPH-CPP (Pharmacist) on 09/20/23 at 1055  Med List Status: <None>   Medication Order Taking? Sig Documenting Provider Last Dose Status Informant  Accu-Chek FastClix Lancets MISC [id number]  Use daily to check blood sugar three times daily.  DXE11.9 Sharlene Dory, DO  Active   Ascorbic Acid (VITAMIN C) 1000 MG tablet [id number]  Take 1,000 mg by mouth daily. [provider]  Active   aspirin EC 81 MG tablet [id number] Yes Take 1  tablet (81 mg total) by mouth daily. Swallow whole. Revankar, Aundra Dubin, MD Taking Active   atorvastatin (LIPITOR) 80 MG tablet [id number] Yes Take 1 tablet (80 mg total) by mouth daily. Sharlene Dory, DO Taking Active   BRILINTA 90 MG TABS tablet [id number] Yes Take 1 tablet (90 mg total) by mouth 2 (two) times daily. Sharlene Dory, DO Taking Active   calcium-vitamin D (OSCAL WITH D) 500-5 MG-MCG tablet [id number]  Take 1 tablet by  mouth. [provider]  Active   citalopram (CELEXA) 40 MG tablet [id number] Yes Take 1 tablet (40 mg total) by mouth daily. Sharlene Dory, DO Taking Active   clonazePAM (KLONOPIN) 0.5 MG tablet [id number] No Take 0.5-1 tablets (0.25-0.5 mg total) by mouth 2 (two) times daily as needed for anxiety.  Patient not taking: Reported on 08/12/2023   Sharlene Dory, DO Not Taking Active   cyanocobalamin (VITAMIN B12) 1000 MCG tablet [id number]  Take 1,000 mcg by mouth daily. [provider]  Active   dapagliflozin propanediol (FARXIGA) 10 MG TABS tablet [id number] Yes Take 1 tablet (10 mg total) by mouth daily before breakfast. Sharlene Dory, DO Taking Active   glucose blood (ACCU-CHEK AVIVA PLUS) test strip [id number] Yes Use three days to check blood sugar.  DX E11.9 Sharlene Dory, DO Taking Active   ibuprofen (ADVIL) 200 MG tablet [id number]  Take 200 mg by mouth every 6 (six) hours as needed. [provider]  Active   levocetirizine (XYZAL) 5 MG tablet [id number] Yes Take 1 tablet (5 mg total) by mouth every evening. Sharlene Dory, DO Taking Active   lisinopril (ZESTRIL) 5 MG tablet [id number] Yes Take 1 tablet (5 mg total) by mouth daily. Sharlene Dory, DO Taking Active   metFORMIN (GLUCOPHAGE) 1000 MG tablet [id number] Yes Take 1 tablet (1,000 mg total) by mouth 2 (two) times daily with a meal. Sharlene Dory, DO Taking Active   metoprolol succinate (TOPROL-XL) 50 MG 24 hr tablet [id number] Yes Take 1 tablet (50 mg total) by mouth daily. Take with or immediately following a meal. Sharlene Dory, DO Taking Active   montelukast (SINGULAIR) 10 MG tablet [id number] Yes Take 1 tablet (10 mg total) by mouth at bedtime. Sharlene Dory, DO Taking Active   nitroGLYCERIN (NITROSTAT) 0.4 MG SL tablet [id number]  DISSOLVE ONE TABLET UNDER TONGUE AS NEEDED FOR CHEST PAIN. EVERY 5(FIVE) MINUTES FOR 3 DOSES Sharlene Dory, DO  Active   pantoprazole (PROTONIX) 20 MG tablet [id number] Yes Take 1 tablet (20 mg total) by mouth daily. Sharlene Dory, DO Taking Active   QUEtiapine (SEROQUEL) 25 MG tablet [id number] Yes Take 1 tablet (25 mg total) by mouth at bedtime. Sharlene Dory, DO Taking Active   repaglinide (PRANDIN) 1 MG tablet [id number] Yes TAKE 1 TABLET(1 MG) BY MOUTH THREE TIMES DAILY BEFORE MEALS Wendling, Jilda Roche, DO Taking Active   tirzepatide [facility]) 12.5 MG/0.5ML Pen [id number] No Inject 12.5 mg into the skin once a week for 28 days.  Patient not taking: Reported on 09/20/2023   Sharlene Dory, DO Not Taking Active   tirzepatide [facility]) 15 MG/0.5ML Pen [id number] No Inject 15 mg into the skin once a week.  Patient not taking: Reported on 09/20/2023   Sharlene Dory, DO Not Taking Active   Zinc 50 MG TABS [id number] Yes Take 1 tablet by mouth daily. [provider] Taking Active  Assessment/Plan:   Diabetes: Last A1c was 7.9% was not at goal - given ASCVD history (goal A1c < 7.5%) - Recommend to continue current regimen for diabetes -  Farxiga, metformin and repaglinide  - Increase Mounjaro as planned to 12.5mg  weekly.   - Recommend continue to check glucose daily and record  Hyperlipidemia/ASCVD Risk Reduction:Currently controlled. LDL goal < 55 - Reviewed long term complications of uncontrolled cholesterol - Recommend to continue atorvastatin and Brilinta  Medication management:  Reviewed medication list and updated Reviewed refill history and discussed adherence. Assisted patient with requesting refills for Brilinta, farxiga, atorvastatin lisinopril, metformin, metoprolol, pantoprazole, repaglinide and blood glucose testing supplies.  Discussed 2025 Medicare changes.Use Medicare.gov to estimate patient's 2025 cost (for the full year his total medication cost estimate is about $90)    Follow Up Plan: 3  months  Henrene Pastor, PharmD Clinical Pharmacist [facility]

## 2023-09-20 NOTE — Telephone Encounter (Signed)
Patient's wife notified. She has already cancelled appointment.

## 2023-09-20 NOTE — Telephone Encounter (Signed)
Patient has ultrasound of aorta  - Vas scheduled for 09/23/2023 at 10am. It is scheduled for [facility] at [facility] on [address] His wife thought this appointment was at [facility].  Mrs. Turton said Mr. Stiltz has had an ultrasound of aorta done at the [facility] imaging in the past. Verified it was completed 08/11/2019.  The [facility] imaging department states that they can still do an aortic ultrasound but the order that was placed 08/13/2023 has to be done at vascular specialist because of the order (see below)  VAS US Aorta versus just US Aorta. Will check with Dr Carmelia Roller to see if he specifically requested this test at vascular specialist versus the [place] of Aorta at [facility] imagine.

## 2023-09-23 ENCOUNTER — Ambulatory Visit (HOSPITAL_COMMUNITY): Payer: Medicare Other

## 2023-10-14 ENCOUNTER — Other Ambulatory Visit: Payer: Medicare Other

## 2023-10-16 ENCOUNTER — Encounter: Payer: Medicare Other | Admitting: Family Medicine

## 2023-10-24 ENCOUNTER — Telehealth: Payer: Self-pay

## 2023-10-24 ENCOUNTER — Other Ambulatory Visit: Payer: Self-pay | Admitting: Family Medicine

## 2023-10-24 DIAGNOSIS — E1165 Type 2 diabetes mellitus with hyperglycemia: Secondary | ICD-10-CM

## 2023-10-24 NOTE — Telephone Encounter (Signed)
Copied from CRM [id number]. Topic: Clinical - Medication Question >> Oct 24, 2023  2:54 PM Clayton Bibles wrote: Reason for CRM: Spouse has a question about Mounjaro. She is requesting Dr. Carmelia Roller nurse to give her a call at [phone] on 10/25/23 in the morning

## 2023-10-25 NOTE — Addendum Note (Signed)
Addended by: Radene Gunning on: 10/25/2023 10:23 AM   Modules accepted: Orders

## 2023-10-25 NOTE — Telephone Encounter (Signed)
Let's stop the Prandin and move up on the dosage as planned. Ty.

## 2023-10-25 NOTE — Telephone Encounter (Signed)
 Called pt no answer and not able to lvm.

## 2023-10-28 NOTE — Telephone Encounter (Signed)
Gary Barnett called back to go over info. All CMAs were unavailable at the time and advised a message would be sent back to call her back. Routed HP due to spousal concern for pt diabetes management.

## 2023-10-29 ENCOUNTER — Encounter: Payer: Self-pay | Admitting: Family Medicine

## 2023-10-29 ENCOUNTER — Telehealth: Payer: Medicare Other | Admitting: Family Medicine

## 2023-10-29 DIAGNOSIS — E1165 Type 2 diabetes mellitus with hyperglycemia: Secondary | ICD-10-CM | POA: Diagnosis not present

## 2023-10-29 DIAGNOSIS — Z7985 Long-term (current) use of injectable non-insulin antidiabetic drugs: Secondary | ICD-10-CM

## 2023-10-29 MED ORDER — TIRZEPATIDE 15 MG/0.5ML ~~LOC~~ SOAJ: 15.0000 mg | SUBCUTANEOUS | 1 refills | Status: DC

## 2023-10-29 NOTE — Progress Notes (Signed)
Chief Complaint  Patient presents with   Medication Problem    Discuss medication    Subjective: Patient is a 72 y.o. male here for discussion of DM. We are interacting via web portal for an electronic face-to-face visit. I verified patient's ID using 2 identifiers. Patient agreed to proceed with visit via this method. Patient is at home, I am at office. Patient, his wife Tammy and I are present for visit.   Pt is taking Mounjaro 12.5 mg/week. Compliant, no AE's. Sugars have been running in the 70-90's. Dropping to the 40's and 50's in the night. Does not feel good, feels that he needs to eat. Happens every couple nights on average. He is also on Farxiga 10 mg/d, metformin 1000 mg bid, Prandin 1 mg TID. Diet is improved.   Past Medical History:  Diagnosis Date   Anxiety 11/11/2017   Benign prostatic hyperplasia 04/13/2014   Formatting of this note might be different from the original. 10/1 IMO update   CAD (coronary artery disease) 09/30/2015   Change in bowel habits 06/20/2016   Diabetes mellitus due to underlying condition with unspecified complications (HCC) 06/18/2017   Diabetes mellitus without complication (HCC)    Encounter for colonoscopy due to history of adenomatous colonic polyps 05/20/2014   Essential hypertension 08/09/2014   GAD (generalized anxiety disorder) 05/04/2020   Hyperlipemia 08/09/2014   Hyperlipidemia    Hypertension    Insomnia 06/03/2019   Major depressive disorder with single episode, in full remission (HCC) 11/11/2017   Malaise 03/20/2018   Primary osteoarthritis involving multiple joints 11/11/2017   Risk for falls 01/23/2016   Screening for malignant neoplasm of prostate 03/20/2018   Seasonal allergies 08/03/2021   Sigmoid diverticulosis 07/08/2014   Type 2 diabetes mellitus with hyperglycemia, without long-term current use of insulin (HCC) 11/21/2015   Vitamin D deficiency 08/09/2014    Objective: No conversational dyspnea Age appropriate judgment and insight Nml  affect and mood  Assessment and Plan: Type 2 diabetes mellitus with hyperglycemia, without long-term current use of insulin (HCC) - Plan: tirzepatide (MOUNJARO) 15 MG/0.5ML Pen  Chronic, improving. Counseled on diet/exercise. Increase Mounjaro to 15 mg/week and stop the Prandin given the hypoglycemia. Fu as originally scheduled.  The patient voiced understanding and agreement to the plan.  Jilda Roche [place], DO 10/29/23  11:46 AM

## 2023-11-05 ENCOUNTER — Other Ambulatory Visit: Payer: Self-pay | Admitting: Family Medicine

## 2023-11-05 ENCOUNTER — Ambulatory Visit: Payer: Self-pay | Admitting: Family Medicine

## 2023-11-05 NOTE — Telephone Encounter (Signed)
FYI  Pt does have an appt 11/12/23

## 2023-11-05 NOTE — Telephone Encounter (Signed)
 Called pt back lvm regarding low blood sugar per Dr.Wendling to have him stop  Metformin. We will see him at the visit on 11/12/23.

## 2023-11-05 NOTE — Telephone Encounter (Signed)
 Copied from CRM [id number]. Topic: Clinical - Pink Word Triage >> Nov 05, 2023  9:57 AM Gary Barnett wrote: Reason for Triage: Patient's wife Gary Barnett [phone] states patient is having a hard time with managing diabetes. Per Gary Barnett, patient is not eating maybe a few bites, lost a lot of weight, sugar is getting low with Mounjaro  side effects. Wants to speak with a nurse.   Chief Complaint: Medication Side Effects       Symptoms: Weight Loss, Poor Appetite, Low Blood Sugar Disposition: [] ED /[] Urgent Care (no appt availability in office) / [] Appointment(In office/virtual)/ []  [facility]/ [] Home Care/ [] Refused Recommended Disposition /[] [place] [facility]/ []  Follow-up with PCP Additional Notes: [name] is a 72 year old male being triaged for medication effects. Massive call failure during initial triage. Call placed to patient. LVM. Call back queued.

## 2023-11-05 NOTE — Telephone Encounter (Signed)
  Chief Complaint: Medication question  Symptoms: Loss of appetite, low blood sugar at night Frequency: Constant  Disposition: [] ED /[] Urgent Care (no appt availability in office) / [x] Appointment(In office/virtual)/ []  [facility] [facility]/ [] Home Care/ [] Refused Recommended Disposition /[] [place] [place] [facility]/ []  Follow-up with PCP Additional Notes: Patient's wife reports that since the patient started Mounjaro  he has had loss of appetite. She states that due to the loss of appetite he has had occasional dips into los blood sugar in the 50's, which they have been able to raise without complication. She is requesting an appointment in office for the patient to see Dr. Frann. Next available appointment is on 11/12/23. She states that appointment would be fine. She has been advised to call back for any worsening symptoms and to take the patient to Urgent Care to the ED if needed. She understood and is agreeable with this plan.   Reason for Disposition  Prescription request for new medicine (not a refill)  Answer Assessment - Initial Assessment Questions 1. NAME of MEDICINE: What medicine(s) are you calling about?     Mounjaro   2. QUESTION: What is your question? (e.g., double dose of medicine, side effect)     Side effects 3. PRESCRIBER: Who prescribed the medicine? Reason: if prescribed by specialist, call should be referred to that group.     Dr. Frann 4. SYMPTOMS: Do you have any symptoms? If Yes, ask: What symptoms are you having?  How bad are the symptoms (e.g., mild, moderate, severe)     Loss of appetite  Protocols used: Medication Question Call-A-AH

## 2023-11-11 NOTE — Telephone Encounter (Signed)
 Gary Barnett from [facility] called to discuss this patient's MRI appointment. Please call her at [phone] ext [phone].

## 2023-11-11 NOTE — Telephone Encounter (Signed)
 They had to reschedule his appointment because they didn't have his insurance information. He is scheduled for 2/11, I will defer his order and get the PA closer to that date.

## 2023-11-12 ENCOUNTER — Ambulatory Visit (INDEPENDENT_AMBULATORY_CARE_PROVIDER_SITE_OTHER): Payer: Medicare Other | Admitting: Family Medicine

## 2023-11-12 ENCOUNTER — Encounter: Payer: Self-pay | Admitting: Family Medicine

## 2023-11-12 ENCOUNTER — Other Ambulatory Visit: Payer: Self-pay

## 2023-11-12 VITALS — BP 112/78 | HR 80 | Temp 98.0°F | Resp 16 | Ht 73.0 in | Wt 178.2 lb

## 2023-11-12 DIAGNOSIS — K219 Gastro-esophageal reflux disease without esophagitis: Secondary | ICD-10-CM | POA: Diagnosis not present

## 2023-11-12 DIAGNOSIS — E1165 Type 2 diabetes mellitus with hyperglycemia: Secondary | ICD-10-CM | POA: Diagnosis not present

## 2023-11-12 DIAGNOSIS — F411 Generalized anxiety disorder: Secondary | ICD-10-CM

## 2023-11-12 LAB — LIPID PANEL
Cholesterol: 115 mg/dL (ref 0–200)
HDL: 29.3 mg/dL — ABNORMAL LOW (ref >39.00)
LDL Cholesterol: 59 mg/dL (ref 0–99)
NonHDL: 85.24
Total CHOL/HDL Ratio: 4
Triglycerides: 132 mg/dL (ref 0.0–149.0)
VLDL: 26.4 mg/dL (ref 0.0–40.0)

## 2023-11-12 LAB — COMPREHENSIVE METABOLIC PANEL
ALT: 17 U/L (ref 0–53)
AST: 12 U/L (ref 0–37)
Albumin: 4.4 g/dL (ref 3.5–5.2)
Alkaline Phosphatase: 129 U/L — ABNORMAL HIGH (ref 39–117)
BUN: 18 mg/dL (ref 6–23)
CO2: 27 meq/L (ref 19–32)
Calcium: 8.9 mg/dL (ref 8.4–10.5)
Chloride: 103 meq/L (ref 96–112)
Creatinine, Ser: 0.81 mg/dL (ref 0.40–1.50)
GFR: 89.5 mL/min (ref >60.00)
Glucose, Bld: 119 mg/dL — ABNORMAL HIGH (ref 70–99)
Potassium: 4.3 meq/L (ref 3.5–5.1)
Sodium: 140 meq/L (ref 135–145)
Total Bilirubin: 1 mg/dL (ref 0.2–1.2)
Total Protein: 6.3 g/dL (ref 6.0–8.3)

## 2023-11-12 LAB — HEMOGLOBIN A1C: Hgb A1c MFr Bld: 6.8 % — ABNORMAL HIGH (ref 4.6–6.5)

## 2023-11-12 LAB — MICROALBUMIN / CREATININE URINE RATIO
Creatinine,U: 84.2 mg/dL
Microalb Creat Ratio: 0.8 mg/g (ref 0.0–30.0)
Microalb, Ur: <0.7 mg/dL (ref 0.0–1.9)

## 2023-11-12 MED ORDER — PANTOPRAZOLE SODIUM 20 MG PO TBEC: 20.0000 mg | DELAYED_RELEASE_TABLET | Freq: Every day | ORAL | 3 refills | Status: DC

## 2023-11-12 MED ORDER — LEVOCETIRIZINE DIHYDROCHLORIDE 5 MG PO TABS: 5.0000 mg | ORAL_TABLET | Freq: Every evening | ORAL | 3 refills | Status: DC

## 2023-11-12 MED ORDER — ACCU-CHEK FASTCLIX LANCETS MISC: 3 refills | Status: AC

## 2023-11-12 MED ORDER — ACCU-CHEK AVIVA PLUS VI STRP: ORAL_STRIP | 3 refills | Status: DC

## 2023-11-12 MED ORDER — FLUTICASONE PROPIONATE 50 MCG/ACT NA SUSP: 2.0000 | Freq: Every day | NASAL | 6 refills | Status: DC

## 2023-11-12 MED ORDER — CITALOPRAM HYDROBROMIDE 40 MG PO TABS: 40.0000 mg | ORAL_TABLET | Freq: Every day | ORAL | 3 refills | Status: DC

## 2023-11-12 MED ORDER — LISINOPRIL 5 MG PO TABS: 5.0000 mg | ORAL_TABLET | Freq: Every day | ORAL | 3 refills | Status: DC

## 2023-11-12 NOTE — Progress Notes (Signed)
 Chief Complaint  Patient presents with   Follow-up    Follow up blood sugar     Subjective: Patient is a 73 y.o. male here for f/u. Here w spouse.  DM- sugars running 90-110's. Taking Farxiga  10 mg/d, Mounjaro  15 mg/week. Compliant, no AE's. No longer on SU or metformin . Still losing wt. Diet is healthy. Walking for exercise. No CP or SOB. No longer having hypoglycemia.   GERD- compliant w Protonix  40 mg/d. No AEs. No difficulty swallowing, burning, bleeding, unintentionally wt loss.   GAD- currently on Celexa  40 mg/d and Seroquel  25 mg qhs. Reports compliance, no AE's. Not following w a counselor. No HI or SI. No self medication. This med combo is working well for him.   Past Medical History:  Diagnosis Date   Anxiety 11/11/2017   Benign prostatic hyperplasia 04/13/2014   Formatting of this note might be different from the original. 10/1 IMO update   CAD (coronary artery disease) 09/30/2015   Change in bowel habits 06/20/2016   Diabetes mellitus due to underlying condition with unspecified complications (HCC) 06/18/2017   Diabetes mellitus without complication (HCC)    Encounter for colonoscopy due to history of adenomatous colonic polyps 05/20/2014   Essential hypertension 08/09/2014   GAD (generalized anxiety disorder) 05/04/2020   Hyperlipemia 08/09/2014   Hyperlipidemia    Hypertension    Insomnia 06/03/2019   Major depressive disorder with single episode, in full remission (HCC) 11/11/2017   Malaise 03/20/2018   Primary osteoarthritis involving multiple joints 11/11/2017   Risk for falls 01/23/2016   Screening for malignant neoplasm of prostate 03/20/2018   Seasonal allergies 08/03/2021   Sigmoid diverticulosis 07/08/2014   Type 2 diabetes mellitus with hyperglycemia, without long-term current use of insulin (HCC) 11/21/2015   Vitamin D  deficiency 08/09/2014    Objective: BP 112/78   Pulse 80   Temp 98 F (36.7 C) (Oral)   Resp 16   Ht 6' 1 (1.854 m)   Wt 178 lb 3.2 oz (80.8 kg)    SpO2 98%   BMI 23.51 kg/m  General: Awake, appears stated age Heart: RRR, no LE edema Lungs: CTAB, no rales, wheezes or rhonchi. No accessory muscle use Psych: normal affect  Assessment and Plan: Type 2 diabetes mellitus with hyperglycemia, without long-term current use of insulin (HCC)  Gastroesophageal reflux disease, unspecified whether esophagitis present  GAD (generalized anxiety disorder) - Plan: citalopram  (CELEXA ) 40 MG tablet  Chronic, stable. Cont Mounjaro  15 mg/week, Farxiga  10 mg/d. Pt more comfortable using Accu chek strips and meter.  Will resend.  Chronic, stable. Cont Protonix  40 mg/d.  Chronic, stable. Cont Celexa  40 mg/d, Seroquel  25 mg qhs.  Follow-up in 6 months for physical or as needed. The patient and his spouse voiced understanding and agreement to the plan.  Mabel Mt [place], DO 11/12/23  9:28 AM

## 2023-11-12 NOTE — Patient Instructions (Addendum)
 Keep checking your sugars.   Give [place] 2-3 business days to get the results of your labs back.   Keep the diet clean and stay active.  Let us know if you need anything.

## 2023-11-13 ENCOUNTER — Other Ambulatory Visit: Payer: Medicare Other

## 2023-11-13 ENCOUNTER — Other Ambulatory Visit: Payer: Self-pay

## 2023-11-13 DIAGNOSIS — E1165 Type 2 diabetes mellitus with hyperglycemia: Secondary | ICD-10-CM

## 2023-12-04 ENCOUNTER — Encounter: Payer: Medicare Other | Admitting: Family Medicine

## 2023-12-10 DIAGNOSIS — E119 Type 2 diabetes mellitus without complications: Secondary | ICD-10-CM | POA: Diagnosis not present

## 2023-12-11 ENCOUNTER — Other Ambulatory Visit (INDEPENDENT_AMBULATORY_CARE_PROVIDER_SITE_OTHER): Payer: Medicare Other

## 2023-12-11 DIAGNOSIS — E1165 Type 2 diabetes mellitus with hyperglycemia: Secondary | ICD-10-CM | POA: Diagnosis not present

## 2023-12-11 LAB — HEPATIC FUNCTION PANEL
ALT: 13 U/L (ref 0–53)
AST: 12 U/L (ref 0–37)
Albumin: 4.4 g/dL (ref 3.5–5.2)
Alkaline Phosphatase: 133 U/L — ABNORMAL HIGH (ref 39–117)
Bilirubin, Direct: 0.2 mg/dL (ref 0.0–0.3)
Total Bilirubin: 1.2 mg/dL (ref 0.2–1.2)
Total Protein: 6.5 g/dL (ref 6.0–8.3)

## 2023-12-11 LAB — ALKALINE PHOSPHATASE: Alkaline Phosphatase: 133 U/L — ABNORMAL HIGH (ref 39–117)

## 2023-12-11 LAB — GAMMA GT: GGT: 15 U/L (ref 7–51)

## 2023-12-17 ENCOUNTER — Other Ambulatory Visit: Payer: Medicare Other

## 2024-01-07 ENCOUNTER — Other Ambulatory Visit: Payer: Self-pay | Admitting: Family Medicine

## 2024-01-07 DIAGNOSIS — J302 Other seasonal allergic rhinitis: Secondary | ICD-10-CM

## 2024-01-21 ENCOUNTER — Ambulatory Visit
Admission: RE | Admit: 2024-01-21 | Discharge: 2024-01-21 | Disposition: A | Discharge status: Home / Self Care | Payer: Medicare Other | Source: Ambulatory Visit | Attending: Diagnostic Neuroimaging | Admitting: Diagnostic Neuroimaging

## 2024-01-21 DIAGNOSIS — R413 Other amnesia: Secondary | ICD-10-CM | POA: Diagnosis not present

## 2024-01-23 ENCOUNTER — Telehealth: Payer: Self-pay

## 2024-01-23 NOTE — Telephone Encounter (Signed)
 Contacted pt and wife, informed them MRI scan of the brain without contrast showing mild age-related changes of chronic small vessel disease and generalized cerebral atrophy. No major concerns from MD. Advised patient to call the office back with any additional questions or concerns in the future.  They verbally understood and was appreciative.

## 2024-01-23 NOTE — Telephone Encounter (Signed)
-----   Message from [facility] sent at 01/22/2024  3:52 PM EDT ----- Mild atrophy and chronic small vessel ischemic disease. No major findings. Continue current plan. -VRP

## 2024-01-24 ENCOUNTER — Telehealth: Payer: Self-pay | Admitting: Emergency Medicine

## 2024-01-24 NOTE — Telephone Encounter (Signed)
 Pt has not been keeping a record of BP , stated another physican placed him on the medication due to his heart not beating regularly , and thought you said d/c medication at last visit and she wasn't sure

## 2024-01-24 NOTE — Telephone Encounter (Signed)
 If BP is good, we can stop it. Has it been running low?

## 2024-01-24 NOTE — Telephone Encounter (Signed)
 Copied from CRM [id number]. Topic: Clinical - Medication Question >> Jan 24, 2024 10:30 AM Elizebeth Brooking wrote: Reason for CRM: Patient wife called in wanting to know if he needs to stop taking the medication lisinopril , would like a callback regarding this

## 2024-01-24 NOTE — Telephone Encounter (Signed)
 Renal function reviewed, let's DC. Ty.

## 2024-01-24 NOTE — Addendum Note (Signed)
 Addended by: Radene Gunning on: 01/24/2024 04:57 PM   Modules accepted: Orders

## 2024-01-29 ENCOUNTER — Other Ambulatory Visit: Payer: Self-pay | Admitting: Family Medicine

## 2024-01-29 DIAGNOSIS — F419 Anxiety disorder, unspecified: Secondary | ICD-10-CM

## 2024-01-29 NOTE — Telephone Encounter (Unsigned)
 Copied from CRM [id number]. Topic: Clinical - Medication Refill >> Jan 29, 2024  4:15 PM Fredrich Romans wrote: Most Recent Primary Care Visit:  Provider: [facility]  Department: [facility]  Visit Type: LAB VISIT  Date: 12/11/2023  Medication: QUEtiapine (SEROQUEL) 25 MG tablet  Has the patient contacted their pharmacy? no (Agent: If no, request that the patient contact the pharmacy for the refill. If patient does not wish to contact the pharmacy document the reason why and proceed with request.) (Agent: If yes, when and what did the pharmacy advise?)  Is this the correct pharmacy for this prescription? Yes If no, delete pharmacy and type the correct one.  This is the patient's preferred pharmacy:  [id number] [place], [place] - [address]  [address] [place] [address] Phone: [phone] Fax: [phone]     Has the prescription been filled recently? No  Is the patient out of the medication? No(few left)  Has the patient been seen for an appointment in the last year OR does the patient have an upcoming appointment? yes  Can we respond through MyChart? No  Agent: Please be advised that Rx refills may take up to 3 business days. We ask that you follow-up with your pharmacy.

## 2024-01-30 MED ORDER — QUETIAPINE FUMARATE 25 MG PO TABS: 25.0000 mg | ORAL_TABLET | Freq: Every day | ORAL | 1 refills | Status: DC

## 2024-01-31 DIAGNOSIS — I10 Essential (primary) hypertension: Secondary | ICD-10-CM | POA: Diagnosis not present

## 2024-01-31 DIAGNOSIS — E7800 Pure hypercholesterolemia, unspecified: Secondary | ICD-10-CM | POA: Diagnosis not present

## 2024-01-31 DIAGNOSIS — I252 Old myocardial infarction: Secondary | ICD-10-CM | POA: Diagnosis not present

## 2024-01-31 DIAGNOSIS — Z955 Presence of coronary angioplasty implant and graft: Secondary | ICD-10-CM | POA: Diagnosis not present

## 2024-01-31 DIAGNOSIS — I2510 Atherosclerotic heart disease of native coronary artery without angina pectoris: Secondary | ICD-10-CM | POA: Diagnosis not present

## 2024-03-27 ENCOUNTER — Other Ambulatory Visit: Payer: Self-pay | Admitting: Family Medicine

## 2024-03-27 DIAGNOSIS — E1165 Type 2 diabetes mellitus with hyperglycemia: Secondary | ICD-10-CM

## 2024-04-07 ENCOUNTER — Telehealth: Payer: Self-pay

## 2024-04-07 ENCOUNTER — Other Ambulatory Visit (HOSPITAL_COMMUNITY): Payer: Self-pay

## 2024-04-07 NOTE — Telephone Encounter (Signed)
 Pharmacy Patient Advocate Encounter   Received notification from CoverMyMeds that prior authorization for Rybelsus  3 is required/requested.   Insurance verification completed.   The patient is insured through [employer] .   Per test claim: The current 30 day co-pay is, $0.00.  No PA needed at this time. This test claim was processed through [facility]- copay amounts may vary at other pharmacies due to pharmacy/plan contracts, or as the patient moves through the different stages of their insurance plan.

## 2024-04-19 ENCOUNTER — Other Ambulatory Visit: Payer: Self-pay | Admitting: Family Medicine

## 2024-05-12 ENCOUNTER — Encounter: Payer: Self-pay | Admitting: Family Medicine

## 2024-05-12 ENCOUNTER — Ambulatory Visit: Payer: Medicare Other | Admitting: Family Medicine

## 2024-05-12 VITALS — BP 116/74 | HR 78 | Temp 98.0°F | Resp 16 | Ht 73.0 in | Wt 168.8 lb

## 2024-05-12 DIAGNOSIS — E1165 Type 2 diabetes mellitus with hyperglycemia: Secondary | ICD-10-CM

## 2024-05-12 DIAGNOSIS — Z0000 Encounter for general adult medical examination without abnormal findings: Secondary | ICD-10-CM

## 2024-05-12 DIAGNOSIS — Z7985 Long-term (current) use of injectable non-insulin antidiabetic drugs: Secondary | ICD-10-CM

## 2024-05-12 LAB — COMPREHENSIVE METABOLIC PANEL WITH GFR
ALT: 17 U/L (ref 0–53)
AST: 15 U/L (ref 0–37)
Albumin: 4.4 g/dL (ref 3.5–5.2)
Alkaline Phosphatase: 115 U/L (ref 39–117)
BUN: 16 mg/dL (ref 6–23)
CO2: 30 meq/L (ref 19–32)
Calcium: 9.1 mg/dL (ref 8.4–10.5)
Chloride: 102 meq/L (ref 96–112)
Creatinine, Ser: 0.9 mg/dL (ref 0.40–1.50)
GFR: 86.39 mL/min (ref >60.00)
Glucose, Bld: 114 mg/dL — ABNORMAL HIGH (ref 70–99)
Potassium: 4.6 meq/L (ref 3.5–5.1)
Sodium: 139 meq/L (ref 135–145)
Total Bilirubin: 1 mg/dL (ref 0.2–1.2)
Total Protein: 6.9 g/dL (ref 6.0–8.3)

## 2024-05-12 LAB — CBC
HCT: 43.4 % (ref 39.0–52.0)
Hemoglobin: 14.5 g/dL (ref 13.0–17.0)
MCHC: 33.3 g/dL (ref 30.0–36.0)
MCV: 89.4 fl (ref 78.0–100.0)
Platelets: 229 K/uL (ref 150.0–400.0)
RBC: 4.86 Mil/uL (ref 4.22–5.81)
RDW: 14 % (ref 11.5–15.5)
WBC: 6.4 K/uL (ref 4.0–10.5)

## 2024-05-12 LAB — LIPID PANEL
Cholesterol: 99 mg/dL (ref 0–200)
HDL: 34.3 mg/dL — ABNORMAL LOW (ref >39.00)
LDL Cholesterol: 47 mg/dL (ref 0–99)
NonHDL: 65.11
Total CHOL/HDL Ratio: 3
Triglycerides: 90 mg/dL (ref 0.0–149.0)
VLDL: 18 mg/dL (ref 0.0–40.0)

## 2024-05-12 LAB — MICROALBUMIN / CREATININE URINE RATIO
Creatinine,U: 92.3 mg/dL
Microalb Creat Ratio: 48.2 mg/g — ABNORMAL HIGH (ref 0.0–30.0)
Microalb, Ur: 4.4 mg/dL — ABNORMAL HIGH (ref 0.0–1.9)

## 2024-05-12 LAB — HEMOGLOBIN A1C: Hgb A1c MFr Bld: 7.1 % — ABNORMAL HIGH (ref 4.6–6.5)

## 2024-05-12 MED ORDER — TIRZEPATIDE 10 MG/0.5ML ~~LOC~~ SOAJ: 10.0000 mg | SUBCUTANEOUS | 1 refills | Status: DC

## 2024-05-12 NOTE — Progress Notes (Signed)
 Chief Complaint  Patient presents with   Annual Exam    CPE    Well Male Gary Barnett is here for a complete physical.   His last physical was >1 year ago.  Current diet: in general, a healthy diet.   Current exercise: Walking, wt lifting Weight trend: losing Fatigue out of ordinary? No. Seat belt? Yes.   Advanced directive? No  Health maintenance Shingrix- Yes Colonoscopy- Yes Tetanus- Yes Hep C- Yes Pneumonia vaccine- Yes  Patient has been losing weight.  His cardiologist told him 175 pounds is a good goal to aim for.  The patient feels the best at 170 pounds and he is still losing weight at 168 currently.  He is on 15 mg of Mounjaro  weekly.  He eats far less than what both he and his wife feel is appropriate.  A1c is now controlled.  Past Medical History:  Diagnosis Date   Anxiety 11/11/2017   Benign prostatic hyperplasia 04/13/2014   Formatting of this note might be different from the original. 10/1 IMO update   CAD (coronary artery disease) 09/30/2015   Change in bowel habits 06/20/2016   Diabetes mellitus due to underlying condition with unspecified complications (HCC) 06/18/2017   Diabetes mellitus without complication (HCC)    Encounter for colonoscopy due to history of adenomatous colonic polyps 05/20/2014   Essential hypertension 08/09/2014   GAD (generalized anxiety disorder) 05/04/2020   Hyperlipemia 08/09/2014   Hyperlipidemia    Hypertension    Insomnia 06/03/2019   Major depressive disorder with single episode, in full remission (HCC) 11/11/2017   Malaise 03/20/2018   Primary osteoarthritis involving multiple joints 11/11/2017   Risk for falls 01/23/2016   Screening for malignant neoplasm of prostate 03/20/2018   Seasonal allergies 08/03/2021   Sigmoid diverticulosis 07/08/2014   Type 2 diabetes mellitus with hyperglycemia, without long-term current use of insulin (HCC) 11/21/2015   Vitamin D  deficiency 08/09/2014     Past Surgical History:  Procedure Laterality Date    CORONARY STENT PLACEMENT     Thinks left main, around age 89    Medications  Current Outpatient Medications on File Prior to Visit  Medication Sig Dispense Refill   Accu-Chek FastClix Lancets MISC Use daily to check blood sugar three times daily.  DXE11.9 306 each 3   Ascorbic Acid (VITAMIN C) 1000 MG tablet Take 1,000 mg by mouth daily.     aspirin  EC 81 MG tablet Take 1 tablet (81 mg total) by mouth daily. Swallow whole. 90 tablet 3   atorvastatin  (LIPITOR) 80 MG tablet Take 1 tablet (80 mg total) by mouth daily. 90 tablet 0   BRILINTA  90 MG TABS tablet Take 1 tablet (90 mg total) by mouth 2 (two) times daily. 180 tablet 0   calcium -vitamin D  (OSCAL WITH D) 500-5 MG-MCG tablet Take 1 tablet by mouth.     citalopram  (CELEXA ) 40 MG tablet Take 1 tablet (40 mg total) by mouth daily. 90 tablet 3   cyanocobalamin (VITAMIN B12) 1000 MCG tablet Take 1,000 mcg by mouth daily.     FARXIGA  10 MG TABS tablet TAKE 1 TABLET(10 MG) BY MOUTH DAILY BEFORE BREAKFAST 90 tablet 0   fluticasone  (FLONASE ) 50 MCG/ACT nasal spray Place 2 sprays into both nostrils daily. 16 g 6   glucose blood (ACCU-CHEK AVIVA PLUS) test strip Use three days to check blood sugar.  DX E11.9 300 each 3   levocetirizine (XYZAL ) 5 MG tablet Take 1 tablet (5 mg total) by mouth every  evening. 90 tablet 3   metoprolol  succinate (TOPROL -XL) 50 MG 24 hr tablet Take 1 tablet (50 mg total) by mouth daily. Take with or immediately following a meal. 90 tablet 1   montelukast  (SINGULAIR ) 10 MG tablet Take 1 tablet (10 mg total) by mouth at bedtime. 90 tablet 1   nitroGLYCERIN  (NITROSTAT ) 0.4 MG SL tablet DISSOLVE ONE TABLET UNDER TONGUE AS NEEDED FOR CHEST PAIN. EVERY 5(FIVE) MINUTES FOR 3 DOSES 25 tablet 0   pantoprazole  (PROTONIX ) 20 MG tablet Take 1 tablet (20 mg total) by mouth daily. 90 tablet 3   QUEtiapine  (SEROQUEL ) 25 MG tablet Take 1 tablet (25 mg total) by mouth at bedtime. 90 tablet 1   Zinc 50 MG TABS Take 1 tablet by mouth  daily.      Allergies Allergies  Allergen Reactions   Clopidogrel Hives and Itching    Family History Family History  Problem Relation Age of Onset   Heart attack Brother    Heart disease Brother    Hyperlipidemia Mother    Hypertension Father     Review of Systems: Constitutional:  no fevers Eye:  no recent significant change in vision Ears:  No changes in hearing Nose/Mouth/Throat:  no complaints of nasal congestion, no sore throat Cardiovascular: no chest pain Respiratory:  No shortness of breath Gastrointestinal:  No change in bowel habits GU:  No frequency Integumentary:  no abnormal skin lesions reported Neurologic:  no headaches Endocrine:  denies unexplained weight changes  Exam BP 116/74 (BP Location: Left Arm, Patient Position: Sitting)   Pulse 78   Temp 98 F (36.7 C) (Oral)   Resp 16   Ht 6' 1 (1.854 m)   Wt 168 lb 12.8 oz (76.6 kg)   SpO2 98%   BMI 22.27 kg/m  General:  well developed, well nourished, in no apparent distress Skin:  no significant moles, warts, or growths Head:  no masses, lesions, or tenderness Eyes:  pupils equal and round, sclera anicteric without injection Ears:  canals without lesions, TMs shiny without retraction, no obvious effusion, no erythema Nose:  nares patent, mucosa normal Throat/Pharynx:  lips and gingiva without lesion; tongue and uvula midline; non-inflamed pharynx; no exudates or postnasal drainage Lungs:  clear to auscultation, breath sounds equal bilaterally, no respiratory distress Cardio:  regular rate and rhythm, no LE edema or bruits Rectal: Deferred GI: BS+, S, NT, ND, no masses or organomegaly Musculoskeletal:  symmetrical muscle groups noted without atrophy or deformity Neuro:  gait normal; deep tendon reflexes normal and symmetric Psych: well oriented with normal range of affect and appropriate judgment/insight  Assessment and Plan  Well adult exam  Type 2 diabetes mellitus with hyperglycemia,  without long-term current use of insulin (HCC) - Plan: CBC, Comprehensive metabolic panel with GFR, Lipid panel, Hemoglobin A1c, Microalbumin / creatinine urine ratio   Well 73 y.o. male. Counseled on diet and exercise. DM 2: Adverse effect of medication.  Decrease Mounjaro  from 15 mg weekly to 10 mg weekly.  They will let me know if there are any continued issues with this. Advanced directive form requested today.  Other orders as above. Follow up in 6 mo.  The patient voiced understanding and agreement to the plan.  Mabel Mt [place], DO 05/12/24 10:33 AM

## 2024-05-12 NOTE — Patient Instructions (Addendum)
 Give [place] 2-3 business days to get the results of your labs back.   Keep the diet clean and stay active.  Please get me a copy of your advanced directive form at your convenience.   Let us know if you need anything.

## 2024-05-13 ENCOUNTER — Telehealth: Payer: Self-pay

## 2024-05-13 ENCOUNTER — Ambulatory Visit: Payer: Self-pay | Admitting: Family Medicine

## 2024-05-13 ENCOUNTER — Other Ambulatory Visit: Payer: Self-pay

## 2024-05-13 DIAGNOSIS — R809 Proteinuria, unspecified: Secondary | ICD-10-CM

## 2024-05-13 NOTE — Telephone Encounter (Signed)
 Copied from CRM [id number]. Topic: Clinical - Lab/Test Results >> May 13, 2024 11:05 AM Viola FALCON wrote: Reason for CRM: Patient wife Emmie returned Shamaine's phone call, would like a call back to discuss patients medications/diabetes. Please call her at [phone] (M)

## 2024-05-13 NOTE — Telephone Encounter (Signed)
 Spoke  with pt wife

## 2024-05-18 ENCOUNTER — Other Ambulatory Visit

## 2024-05-22 ENCOUNTER — Telehealth: Payer: Self-pay

## 2024-05-22 NOTE — Telephone Encounter (Signed)
 Message sent

## 2024-05-24 NOTE — Telephone Encounter (Signed)
 Please place order again. Thx.

## 2024-05-25 ENCOUNTER — Other Ambulatory Visit (INDEPENDENT_AMBULATORY_CARE_PROVIDER_SITE_OTHER)

## 2024-05-25 DIAGNOSIS — R809 Proteinuria, unspecified: Secondary | ICD-10-CM

## 2024-05-26 ENCOUNTER — Other Ambulatory Visit: Payer: Self-pay

## 2024-05-26 DIAGNOSIS — Z136 Encounter for screening for cardiovascular disorders: Secondary | ICD-10-CM

## 2024-05-26 LAB — MICROALBUMIN / CREATININE URINE RATIO
Creatinine,U: 62.5 mg/dL
Microalb Creat Ratio: UNDETERMINED mg/g (ref 0.0–30.0)
Microalb, Ur: <0.7 mg/dL

## 2024-05-26 NOTE — Telephone Encounter (Signed)
 Called pt lvm letting him know we ordered the US  for small aneurysm.

## 2024-05-27 ENCOUNTER — Ambulatory Visit: Payer: Self-pay | Admitting: Family Medicine

## 2024-06-03 ENCOUNTER — Other Ambulatory Visit: Payer: Self-pay | Admitting: Family Medicine

## 2024-06-03 DIAGNOSIS — E1165 Type 2 diabetes mellitus with hyperglycemia: Secondary | ICD-10-CM

## 2024-06-08 ENCOUNTER — Ambulatory Visit (HOSPITAL_BASED_OUTPATIENT_CLINIC_OR_DEPARTMENT_OTHER)
Admission: RE | Admit: 2024-06-08 | Discharge: 2024-06-08 | Disposition: A | Discharge status: Home / Self Care | Source: Ambulatory Visit | Attending: Family Medicine | Admitting: Family Medicine

## 2024-06-08 DIAGNOSIS — E785 Hyperlipidemia, unspecified: Secondary | ICD-10-CM | POA: Insufficient documentation

## 2024-06-08 DIAGNOSIS — E119 Type 2 diabetes mellitus without complications: Secondary | ICD-10-CM | POA: Diagnosis not present

## 2024-06-08 DIAGNOSIS — Z136 Encounter for screening for cardiovascular disorders: Secondary | ICD-10-CM | POA: Insufficient documentation

## 2024-06-08 DIAGNOSIS — I10 Essential (primary) hypertension: Secondary | ICD-10-CM | POA: Insufficient documentation

## 2024-06-08 DIAGNOSIS — I714 Abdominal aortic aneurysm, without rupture, unspecified: Secondary | ICD-10-CM | POA: Diagnosis not present

## 2024-06-19 ENCOUNTER — Ambulatory Visit: Payer: Self-pay | Admitting: Family Medicine

## 2024-06-24 ENCOUNTER — Telehealth: Payer: Self-pay

## 2024-06-24 NOTE — Telephone Encounter (Signed)
 Called pt Lvm per Dr.Wendling US  was good nothing was noted on the report.

## 2024-06-24 NOTE — Telephone Encounter (Signed)
 Not even noted this time. This is good.

## 2024-06-24 NOTE — Telephone Encounter (Signed)
 Copied from CRM [id number]. Topic: General - Other >> Jun 24, 2024  3:05 PM Thersia BROCKS wrote: Reason for CRM: Patient wife called in wanting Gary Barnett to give her a callback regarding in dept US  Results  Called spoke with pt wife and was advised of results and she ask if aneurysm still the same or any changes.

## 2024-07-07 ENCOUNTER — Other Ambulatory Visit: Payer: Self-pay | Admitting: Family Medicine

## 2024-07-15 ENCOUNTER — Other Ambulatory Visit: Payer: Self-pay | Admitting: Family Medicine

## 2024-07-15 DIAGNOSIS — J302 Other seasonal allergic rhinitis: Secondary | ICD-10-CM

## 2024-07-15 DIAGNOSIS — F419 Anxiety disorder, unspecified: Secondary | ICD-10-CM

## 2024-09-08 ENCOUNTER — Other Ambulatory Visit: Payer: Self-pay | Admitting: Family Medicine

## 2024-09-08 DIAGNOSIS — E1165 Type 2 diabetes mellitus with hyperglycemia: Secondary | ICD-10-CM

## 2024-09-25 ENCOUNTER — Telehealth: Payer: Self-pay | Admitting: Family Medicine

## 2024-09-25 NOTE — Telephone Encounter (Signed)
 Copied from CRM [id number]. Topic: Medicare AWV >> Sep 25, 2024  9:40 AM Nathanel DEL wrote: Called LVM 09/25/2024 to sched AWV. Please schedule in office or virtual visit.   Nathanel Paschal; Care Guide Ambulatory Clinical Support [place] [facility] Direct Dial: [phone]

## 2024-09-30 ENCOUNTER — Other Ambulatory Visit: Payer: Self-pay | Admitting: Family Medicine

## 2024-10-09 ENCOUNTER — Other Ambulatory Visit: Payer: Self-pay | Admitting: Family Medicine

## 2024-10-14 ENCOUNTER — Other Ambulatory Visit: Payer: Self-pay | Admitting: Family Medicine

## 2024-10-14 DIAGNOSIS — F411 Generalized anxiety disorder: Secondary | ICD-10-CM

## 2024-11-06 ENCOUNTER — Other Ambulatory Visit: Payer: Self-pay | Admitting: Family Medicine

## 2024-11-24 ENCOUNTER — Ambulatory Visit: Payer: Self-pay | Admitting: Family Medicine

## 2024-11-24 ENCOUNTER — Ambulatory Visit: Admitting: Family Medicine

## 2024-11-24 ENCOUNTER — Encounter: Payer: Self-pay | Admitting: Family Medicine

## 2024-11-24 VITALS — BP 112/72 | HR 84 | Temp 97.8°F | Resp 16 | Ht 73.0 in | Wt 177.0 lb

## 2024-11-24 DIAGNOSIS — F411 Generalized anxiety disorder: Secondary | ICD-10-CM

## 2024-11-24 DIAGNOSIS — E1165 Type 2 diabetes mellitus with hyperglycemia: Secondary | ICD-10-CM

## 2024-11-24 DIAGNOSIS — J302 Other seasonal allergic rhinitis: Secondary | ICD-10-CM

## 2024-11-24 DIAGNOSIS — Z7984 Long term (current) use of oral hypoglycemic drugs: Secondary | ICD-10-CM

## 2024-11-24 DIAGNOSIS — R748 Abnormal levels of other serum enzymes: Secondary | ICD-10-CM

## 2024-11-24 DIAGNOSIS — Z7985 Long-term (current) use of injectable non-insulin antidiabetic drugs: Secondary | ICD-10-CM

## 2024-11-24 DIAGNOSIS — F419 Anxiety disorder, unspecified: Secondary | ICD-10-CM

## 2024-11-24 LAB — COMPREHENSIVE METABOLIC PANEL WITH GFR
ALT: 17 U/L (ref 3–53)
AST: 14 U/L (ref 5–37)
Albumin: 4.6 g/dL (ref 3.5–5.2)
Alkaline Phosphatase: 138 U/L — ABNORMAL HIGH (ref 39–117)
BUN: 22 mg/dL (ref 6–23)
CO2: 27 meq/L (ref 19–32)
Calcium: 9.2 mg/dL (ref 8.4–10.5)
Chloride: 102 meq/L (ref 96–112)
Creatinine, Ser: 0.83 mg/dL (ref 0.40–1.50)
GFR: 88.2 mL/min
Glucose, Bld: 128 mg/dL — ABNORMAL HIGH (ref 70–99)
Potassium: 4 meq/L (ref 3.5–5.1)
Sodium: 138 meq/L (ref 135–145)
Total Bilirubin: 1.5 mg/dL — ABNORMAL HIGH (ref 0.2–1.2)
Total Protein: 6.9 g/dL (ref 6.0–8.3)

## 2024-11-24 LAB — LIPID PANEL
Cholesterol: 110 mg/dL (ref 28–200)
HDL: 31.3 mg/dL — ABNORMAL LOW
LDL Cholesterol: 50 mg/dL (ref 10–99)
NonHDL: 79.03
Total CHOL/HDL Ratio: 4
Triglycerides: 143 mg/dL (ref 10.0–149.0)
VLDL: 28.6 mg/dL (ref 0.0–40.0)

## 2024-11-24 LAB — HEMOGLOBIN A1C: Hgb A1c MFr Bld: 7.8 % — ABNORMAL HIGH (ref 4.6–6.5)

## 2024-11-24 MED ORDER — LEVOCETIRIZINE DIHYDROCHLORIDE 5 MG PO TABS: 5.0000 mg | ORAL_TABLET | Freq: Every evening | ORAL | 3 refills | Status: AC

## 2024-11-24 MED ORDER — ATORVASTATIN CALCIUM 80 MG PO TABS: 80.0000 mg | ORAL_TABLET | Freq: Every day | ORAL | 3 refills | Status: AC

## 2024-11-24 MED ORDER — PANTOPRAZOLE SODIUM 20 MG PO TBEC: 20.0000 mg | DELAYED_RELEASE_TABLET | Freq: Every day | ORAL | 3 refills | Status: AC

## 2024-11-24 MED ORDER — QUETIAPINE FUMARATE 25 MG PO TABS: 25.0000 mg | ORAL_TABLET | Freq: Every day | ORAL | 3 refills | Status: AC

## 2024-11-24 MED ORDER — MONTELUKAST SODIUM 10 MG PO TABS: 10.0000 mg | ORAL_TABLET | Freq: Every day | ORAL | 1 refills | Status: AC

## 2024-11-24 MED ORDER — BRILINTA 90 MG PO TABS: 90.0000 mg | ORAL_TABLET | Freq: Two times a day (BID) | ORAL | 0 refills | Status: AC

## 2024-11-24 MED ORDER — DAPAGLIFLOZIN PROPANEDIOL 10 MG PO TABS: 10.0000 mg | ORAL_TABLET | Freq: Every day | ORAL | 3 refills | Status: AC

## 2024-11-24 MED ORDER — METOPROLOL SUCCINATE ER 50 MG PO TB24: 50.0000 mg | ORAL_TABLET | Freq: Every day | ORAL | 3 refills | Status: AC

## 2024-11-24 MED ORDER — CITALOPRAM HYDROBROMIDE 40 MG PO TABS: 40.0000 mg | ORAL_TABLET | Freq: Every day | ORAL | 3 refills | Status: AC

## 2024-11-24 MED ORDER — MOUNJARO 10 MG/0.5ML ~~LOC~~ SOAJ: 10.0000 mg | SUBCUTANEOUS | 1 refills | Status: AC

## 2024-11-24 MED ORDER — LISINOPRIL 5 MG PO TABS: 5.0000 mg | ORAL_TABLET | Freq: Every day | ORAL | 3 refills | Status: AC

## 2024-11-24 NOTE — Progress Notes (Signed)
 Subjective:   Chief Complaint  Patient presents with   Follow-up    Follow Up    Gary Barnett is a 74 y.o. male here for follow-up of diabetes.   Yolanda's self monitored glucose range is low-mid 100's.  Patient denies hypoglycemic reactions. He checks his glucose levels several times per week Patient does not require insulin.   Medications include: Mounjaro  10 mg/week, Farxiga  10 mg/d Diet is healthy.  Exercise: walking, strength training  GAD Taking Seroquel  25 mg qhs, Celexa  40 mg/d. Compliant, no AE's. No HI or SI. No self medication. Not seeing a therapist currently. S/s's well-controlled.   Past Medical History:  Diagnosis Date   Anxiety 11/11/2017   Benign prostatic hyperplasia 04/13/2014   Formatting of this note might be different from the original. 10/1 IMO update   CAD (coronary artery disease) 09/30/2015   Change in bowel habits 06/20/2016   Diabetes mellitus due to underlying condition with unspecified complications (HCC) 06/18/2017   Diabetes mellitus without complication (HCC)    Encounter for colonoscopy due to history of adenomatous colonic polyps 05/20/2014   Essential hypertension 08/09/2014   GAD (generalized anxiety disorder) 05/04/2020   Hyperlipemia 08/09/2014   Hyperlipidemia    Hypertension    Insomnia 06/03/2019   Major depressive disorder with single episode, in full remission 11/11/2017   Malaise 03/20/2018   Primary osteoarthritis involving multiple joints 11/11/2017   Risk for falls 01/23/2016   Screening for malignant neoplasm of prostate 03/20/2018   Seasonal allergies 08/03/2021   Sigmoid diverticulosis 07/08/2014   Type 2 diabetes mellitus with hyperglycemia, without long-term current use of insulin (HCC) 11/21/2015   Vitamin D  deficiency 08/09/2014     Related testing: Retinal exam: Done Pneumovax: done  Objective:  BP 112/72 (BP Location: Left Arm, Patient Position: Sitting)   Pulse 84   Temp 98 F (36.7 C) (Oral)   Resp 16   Ht 6' 1 (1.854 m)    Wt 177 lb (80.3 kg)   SpO2 96%   BMI 23.35 kg/m  General:  Well developed, well nourished, in no apparent distress Lungs:  CTAB, no access msc use Cardio:  RRR, no bruits, no LE edema Psych: Age appropriate judgment and insight  Assessment:   Type 2 diabetes mellitus with hyperglycemia, without long-term current use of insulin (HCC) - Plan: Comprehensive metabolic panel with GFR, Lipid panel, Hemoglobin A1c  GAD (generalized anxiety disorder)   Plan:   Chronic, stable.  Continue Mounjaro  10 mg weekly, Farxiga  10 mg daily.  Counseled on diet and exercise. Chronic, stable.  Continue Seroquel  25 mg nightly, Celexa  40 mg daily. F/u in 6 mo. The patient voiced understanding and agreement to the plan.  Mabel Mt [place], DO 11/24/24 10:46 AM

## 2024-11-24 NOTE — Patient Instructions (Signed)
 Give us  2-3 business days to get the results of your labs back.   Keep the diet clean and stay active.  Let us  know if you need anything.

## 2024-11-25 ENCOUNTER — Ambulatory Visit

## 2024-11-25 DIAGNOSIS — R748 Abnormal levels of other serum enzymes: Secondary | ICD-10-CM

## 2024-11-27 ENCOUNTER — Telehealth: Payer: Self-pay

## 2024-11-27 NOTE — Telephone Encounter (Signed)
 Called pt Lvm letting him Brilinta  90 mg not covered with insurance and plavix 75 mg sent.

## 2024-11-28 ENCOUNTER — Ambulatory Visit: Payer: Self-pay | Admitting: Family Medicine

## 2024-11-28 DIAGNOSIS — R748 Abnormal levels of other serum enzymes: Secondary | ICD-10-CM

## 2024-11-28 LAB — ALKALINE PHOSPHATASE ISOENZYMES
Alkaline phosphatase (APISO): 149 U/L — ABNORMAL HIGH (ref 35–144)
Bone Isoenzymes: 25 % — ABNORMAL LOW (ref 28–66)
Intestinal Isoenzymes: 6 % (ref 1–24)
Liver Isoenzymes: 69 % (ref 25–69)
Macrohepatic isoenzymes: 0 %
Placental isoenzymes: 0 %

## 2024-12-01 ENCOUNTER — Ambulatory Visit: Payer: Self-pay

## 2024-12-01 NOTE — Telephone Encounter (Signed)
 FYI Only or Action Required?: Action required by provider: medication refill request and clinical question for provider.  Patient was last seen in primary care on 11/24/2024 by Frann Mabel Mt, DO.  Called Nurse Triage reporting Results.  Symptoms began today.  Interventions attempted: Nothing.  Symptoms are: stable.  Triage Disposition: No disposition on file.  Patient/caregiver understands and will follow disposition?:    Copied from CRM #[id number]. Topic: Clinical - Lab/Test Results >> Dec 01, 2024  3:11 PM Larissa RAMAN wrote: Reason for CRM: Questions regarding lab results, A1C   Answer Assessment - Initial Assessment Questions 1. REASON FOR CALL: What is the main reason for your call? or How can I best help you?  Received call from patient's wife. Reports had missed call and requesting results.  Patient wife reports will need prior authorization for Mournjaro. Reports insurance covers Trulicity and Ozempic .  Pt's wife reports will call imaging to scheduled u/s and will call back to schedule f/u appt.  This RN read patient's wife Dr. Frann interpretation/recommendations:  Please let pt know most of his labs are normal/stable. Sugar up a bit from before. Could adjust medication a bit. Would wait to see if Mounjaro  is covered first.    Can we add an alk phos isoenzyme again? Thx.    Plz inform pt his next testing is OK, but I'd like to check an ultrasound of his liver to be on the safe side. Order placed. Thx.    Given the percentage, will ck a RUQ US .  Protocols used: Information Only Call - No Triage-A-AH

## 2024-12-02 NOTE — Telephone Encounter (Signed)
 Patient called lvm need to get ultrasound done of his liver.  Called pt lvm to call the office to discuss  Result.

## 2024-12-03 ENCOUNTER — Telehealth: Payer: Self-pay

## 2024-12-03 NOTE — Telephone Encounter (Signed)
 Copied from CRM [id number]. Topic: Clinical - Lab/Test Results >> Dec 03, 2024  3:55 PM Sasha M wrote: Reason for CRM: Pt wife called in to get clarity on the lab results that require pt to have an US  on his liver. Please call pt wife to discuss further  Called spoke with pt wife and was advised, she stated understand.

## 2024-12-04 ENCOUNTER — Ambulatory Visit (HOSPITAL_BASED_OUTPATIENT_CLINIC_OR_DEPARTMENT_OTHER)
Admission: RE | Admit: 2024-12-04 | Discharge: 2024-12-04 | Disposition: A | Source: Ambulatory Visit | Attending: Family Medicine

## 2024-12-04 DIAGNOSIS — R748 Abnormal levels of other serum enzymes: Secondary | ICD-10-CM | POA: Diagnosis present

## 2024-12-07 ENCOUNTER — Ambulatory Visit: Payer: Self-pay | Admitting: Family Medicine

## 2024-12-07 NOTE — Telephone Encounter (Unsigned)
 Copied from CRM #[id number]. Topic: Clinical - Lab/Test Results >> Dec 07, 2024  2:26 PM Drema MATSU wrote: Reason for CRM: Pt is requesting a callback regarding Ultrasound.

## 2024-12-23 ENCOUNTER — Ambulatory Visit: Admitting: Family Medicine

## 2025-06-01 ENCOUNTER — Encounter: Admitting: Family Medicine
